# Patient Record
Sex: Male | Born: 1945
Health system: Southern US, Community
[De-identification: ages and names within clinical notes are randomized; demographics above are authoritative.]

## PROBLEM LIST (undated history)

## (undated) DIAGNOSIS — N200 Calculus of kidney: Secondary | ICD-10-CM

## (undated) DIAGNOSIS — C801 Malignant (primary) neoplasm, unspecified: Secondary | ICD-10-CM

## (undated) DIAGNOSIS — M419 Scoliosis, unspecified: Secondary | ICD-10-CM

## (undated) DIAGNOSIS — I10 Essential (primary) hypertension: Secondary | ICD-10-CM

## (undated) DIAGNOSIS — Z87442 Personal history of urinary calculi: Secondary | ICD-10-CM

## (undated) DIAGNOSIS — E119 Type 2 diabetes mellitus without complications: Secondary | ICD-10-CM

## (undated) DIAGNOSIS — E669 Obesity, unspecified: Secondary | ICD-10-CM

## (undated) HISTORY — PX: TONSILLECTOMY: SUR1361

## (undated) HISTORY — PX: LITHOTRIPSY: SUR834

---

## 1966-04-26 HISTORY — PX: HERNIA REPAIR: SHX51

## 2003-11-16 ENCOUNTER — Emergency Department (HOSPITAL_COMMUNITY): Admission: EM | Admit: 2003-11-16 | Discharge: 2003-11-16 | Payer: Self-pay | Admitting: Emergency Medicine

## 2009-10-29 ENCOUNTER — Encounter: Admission: RE | Admit: 2009-10-29 | Discharge: 2009-12-22 | Payer: Self-pay | Admitting: Endocrinology

## 2011-04-28 DIAGNOSIS — R0602 Shortness of breath: Secondary | ICD-10-CM | POA: Diagnosis not present

## 2011-04-28 DIAGNOSIS — Z23 Encounter for immunization: Secondary | ICD-10-CM | POA: Diagnosis not present

## 2011-05-05 DIAGNOSIS — M25569 Pain in unspecified knee: Secondary | ICD-10-CM | POA: Diagnosis not present

## 2011-05-05 DIAGNOSIS — M159 Polyosteoarthritis, unspecified: Secondary | ICD-10-CM | POA: Diagnosis not present

## 2011-08-03 DIAGNOSIS — M25569 Pain in unspecified knee: Secondary | ICD-10-CM | POA: Diagnosis not present

## 2011-08-03 DIAGNOSIS — M159 Polyosteoarthritis, unspecified: Secondary | ICD-10-CM | POA: Diagnosis not present

## 2011-08-09 DIAGNOSIS — H43819 Vitreous degeneration, unspecified eye: Secondary | ICD-10-CM | POA: Diagnosis not present

## 2011-08-09 DIAGNOSIS — H5310 Unspecified subjective visual disturbances: Secondary | ICD-10-CM | POA: Diagnosis not present

## 2011-08-09 DIAGNOSIS — H52229 Regular astigmatism, unspecified eye: Secondary | ICD-10-CM | POA: Diagnosis not present

## 2011-08-09 DIAGNOSIS — H5210 Myopia, unspecified eye: Secondary | ICD-10-CM | POA: Diagnosis not present

## 2011-09-02 DIAGNOSIS — M1710 Unilateral primary osteoarthritis, unspecified knee: Secondary | ICD-10-CM | POA: Diagnosis not present

## 2011-11-02 DIAGNOSIS — M159 Polyosteoarthritis, unspecified: Secondary | ICD-10-CM | POA: Diagnosis not present

## 2011-11-12 DIAGNOSIS — H01009 Unspecified blepharitis unspecified eye, unspecified eyelid: Secondary | ICD-10-CM | POA: Diagnosis not present

## 2011-11-12 DIAGNOSIS — H04129 Dry eye syndrome of unspecified lacrimal gland: Secondary | ICD-10-CM | POA: Diagnosis not present

## 2011-11-29 DIAGNOSIS — H01009 Unspecified blepharitis unspecified eye, unspecified eyelid: Secondary | ICD-10-CM | POA: Diagnosis not present

## 2011-11-29 DIAGNOSIS — H04129 Dry eye syndrome of unspecified lacrimal gland: Secondary | ICD-10-CM | POA: Diagnosis not present

## 2012-01-03 DIAGNOSIS — M159 Polyosteoarthritis, unspecified: Secondary | ICD-10-CM | POA: Diagnosis not present

## 2012-01-03 DIAGNOSIS — M25569 Pain in unspecified knee: Secondary | ICD-10-CM | POA: Diagnosis not present

## 2012-01-03 DIAGNOSIS — M1710 Unilateral primary osteoarthritis, unspecified knee: Secondary | ICD-10-CM | POA: Diagnosis not present

## 2012-01-11 DIAGNOSIS — M1710 Unilateral primary osteoarthritis, unspecified knee: Secondary | ICD-10-CM | POA: Diagnosis not present

## 2012-01-11 DIAGNOSIS — M159 Polyosteoarthritis, unspecified: Secondary | ICD-10-CM | POA: Diagnosis not present

## 2012-01-18 DIAGNOSIS — M25569 Pain in unspecified knee: Secondary | ICD-10-CM | POA: Diagnosis not present

## 2012-01-18 DIAGNOSIS — M159 Polyosteoarthritis, unspecified: Secondary | ICD-10-CM | POA: Diagnosis not present

## 2012-01-18 DIAGNOSIS — M1710 Unilateral primary osteoarthritis, unspecified knee: Secondary | ICD-10-CM | POA: Diagnosis not present

## 2012-04-17 DIAGNOSIS — Z0000 Encounter for general adult medical examination without abnormal findings: Secondary | ICD-10-CM | POA: Diagnosis not present

## 2012-04-17 DIAGNOSIS — I10 Essential (primary) hypertension: Secondary | ICD-10-CM | POA: Diagnosis not present

## 2012-04-17 DIAGNOSIS — E119 Type 2 diabetes mellitus without complications: Secondary | ICD-10-CM | POA: Diagnosis not present

## 2012-04-17 DIAGNOSIS — Z23 Encounter for immunization: Secondary | ICD-10-CM | POA: Diagnosis not present

## 2012-04-17 DIAGNOSIS — Z125 Encounter for screening for malignant neoplasm of prostate: Secondary | ICD-10-CM | POA: Diagnosis not present

## 2012-04-27 DIAGNOSIS — I10 Essential (primary) hypertension: Secondary | ICD-10-CM | POA: Diagnosis not present

## 2012-04-27 DIAGNOSIS — Z79899 Other long term (current) drug therapy: Secondary | ICD-10-CM | POA: Diagnosis not present

## 2012-04-27 DIAGNOSIS — E119 Type 2 diabetes mellitus without complications: Secondary | ICD-10-CM | POA: Diagnosis not present

## 2012-04-27 DIAGNOSIS — M79609 Pain in unspecified limb: Secondary | ICD-10-CM | POA: Diagnosis not present

## 2012-04-27 DIAGNOSIS — G589 Mononeuropathy, unspecified: Secondary | ICD-10-CM | POA: Diagnosis not present

## 2012-05-02 DIAGNOSIS — M159 Polyosteoarthritis, unspecified: Secondary | ICD-10-CM | POA: Diagnosis not present

## 2012-08-01 DIAGNOSIS — M159 Polyosteoarthritis, unspecified: Secondary | ICD-10-CM | POA: Diagnosis not present

## 2012-10-25 DIAGNOSIS — E119 Type 2 diabetes mellitus without complications: Secondary | ICD-10-CM | POA: Diagnosis not present

## 2012-10-25 DIAGNOSIS — E789 Disorder of lipoprotein metabolism, unspecified: Secondary | ICD-10-CM | POA: Diagnosis not present

## 2012-11-01 DIAGNOSIS — M79609 Pain in unspecified limb: Secondary | ICD-10-CM | POA: Diagnosis not present

## 2012-11-01 DIAGNOSIS — R7309 Other abnormal glucose: Secondary | ICD-10-CM | POA: Diagnosis not present

## 2012-11-01 DIAGNOSIS — I10 Essential (primary) hypertension: Secondary | ICD-10-CM | POA: Diagnosis not present

## 2012-11-01 DIAGNOSIS — E789 Disorder of lipoprotein metabolism, unspecified: Secondary | ICD-10-CM | POA: Diagnosis not present

## 2012-11-30 DIAGNOSIS — M159 Polyosteoarthritis, unspecified: Secondary | ICD-10-CM | POA: Diagnosis not present

## 2013-04-05 DIAGNOSIS — M25569 Pain in unspecified knee: Secondary | ICD-10-CM | POA: Diagnosis not present

## 2013-04-05 DIAGNOSIS — M1710 Unilateral primary osteoarthritis, unspecified knee: Secondary | ICD-10-CM | POA: Diagnosis not present

## 2013-04-05 DIAGNOSIS — M159 Polyosteoarthritis, unspecified: Secondary | ICD-10-CM | POA: Diagnosis not present

## 2013-04-12 DIAGNOSIS — M25569 Pain in unspecified knee: Secondary | ICD-10-CM | POA: Diagnosis not present

## 2013-04-12 DIAGNOSIS — M159 Polyosteoarthritis, unspecified: Secondary | ICD-10-CM | POA: Diagnosis not present

## 2013-04-12 DIAGNOSIS — M1710 Unilateral primary osteoarthritis, unspecified knee: Secondary | ICD-10-CM | POA: Diagnosis not present

## 2013-04-23 DIAGNOSIS — M159 Polyosteoarthritis, unspecified: Secondary | ICD-10-CM | POA: Diagnosis not present

## 2013-04-23 DIAGNOSIS — M25569 Pain in unspecified knee: Secondary | ICD-10-CM | POA: Diagnosis not present

## 2013-04-23 DIAGNOSIS — M1710 Unilateral primary osteoarthritis, unspecified knee: Secondary | ICD-10-CM | POA: Diagnosis not present

## 2013-04-27 DIAGNOSIS — Z79899 Other long term (current) drug therapy: Secondary | ICD-10-CM | POA: Diagnosis not present

## 2013-04-27 DIAGNOSIS — E119 Type 2 diabetes mellitus without complications: Secondary | ICD-10-CM | POA: Diagnosis not present

## 2013-04-27 DIAGNOSIS — E789 Disorder of lipoprotein metabolism, unspecified: Secondary | ICD-10-CM | POA: Diagnosis not present

## 2013-04-27 DIAGNOSIS — Z125 Encounter for screening for malignant neoplasm of prostate: Secondary | ICD-10-CM | POA: Diagnosis not present

## 2013-04-27 DIAGNOSIS — I10 Essential (primary) hypertension: Secondary | ICD-10-CM | POA: Diagnosis not present

## 2013-05-03 DIAGNOSIS — E739 Lactose intolerance, unspecified: Secondary | ICD-10-CM | POA: Diagnosis not present

## 2013-05-03 DIAGNOSIS — I10 Essential (primary) hypertension: Secondary | ICD-10-CM | POA: Diagnosis not present

## 2013-05-03 DIAGNOSIS — M79609 Pain in unspecified limb: Secondary | ICD-10-CM | POA: Diagnosis not present

## 2013-07-16 DIAGNOSIS — L719 Rosacea, unspecified: Secondary | ICD-10-CM | POA: Diagnosis not present

## 2013-07-16 DIAGNOSIS — H01009 Unspecified blepharitis unspecified eye, unspecified eyelid: Secondary | ICD-10-CM | POA: Diagnosis not present

## 2013-07-16 DIAGNOSIS — H2510 Age-related nuclear cataract, unspecified eye: Secondary | ICD-10-CM | POA: Diagnosis not present

## 2013-10-24 DIAGNOSIS — E789 Disorder of lipoprotein metabolism, unspecified: Secondary | ICD-10-CM | POA: Diagnosis not present

## 2013-10-24 DIAGNOSIS — E119 Type 2 diabetes mellitus without complications: Secondary | ICD-10-CM | POA: Diagnosis not present

## 2013-10-31 DIAGNOSIS — I10 Essential (primary) hypertension: Secondary | ICD-10-CM | POA: Diagnosis not present

## 2013-10-31 DIAGNOSIS — E789 Disorder of lipoprotein metabolism, unspecified: Secondary | ICD-10-CM | POA: Diagnosis not present

## 2014-01-03 DIAGNOSIS — M159 Polyosteoarthritis, unspecified: Secondary | ICD-10-CM | POA: Diagnosis not present

## 2014-01-03 DIAGNOSIS — M25569 Pain in unspecified knee: Secondary | ICD-10-CM | POA: Diagnosis not present

## 2014-04-09 DIAGNOSIS — Z23 Encounter for immunization: Secondary | ICD-10-CM | POA: Diagnosis not present

## 2014-04-29 DIAGNOSIS — Z79899 Other long term (current) drug therapy: Secondary | ICD-10-CM | POA: Diagnosis not present

## 2014-04-29 DIAGNOSIS — Z125 Encounter for screening for malignant neoplasm of prostate: Secondary | ICD-10-CM | POA: Diagnosis not present

## 2014-04-29 DIAGNOSIS — I10 Essential (primary) hypertension: Secondary | ICD-10-CM | POA: Diagnosis not present

## 2014-04-29 DIAGNOSIS — R739 Hyperglycemia, unspecified: Secondary | ICD-10-CM | POA: Diagnosis not present

## 2014-04-29 DIAGNOSIS — E789 Disorder of lipoprotein metabolism, unspecified: Secondary | ICD-10-CM | POA: Diagnosis not present

## 2014-05-06 DIAGNOSIS — E789 Disorder of lipoprotein metabolism, unspecified: Secondary | ICD-10-CM | POA: Diagnosis not present

## 2014-05-06 DIAGNOSIS — E118 Type 2 diabetes mellitus with unspecified complications: Secondary | ICD-10-CM | POA: Diagnosis not present

## 2014-05-06 DIAGNOSIS — M1990 Unspecified osteoarthritis, unspecified site: Secondary | ICD-10-CM | POA: Diagnosis not present

## 2014-05-06 DIAGNOSIS — I10 Essential (primary) hypertension: Secondary | ICD-10-CM | POA: Diagnosis not present

## 2014-05-07 DIAGNOSIS — M170 Bilateral primary osteoarthritis of knee: Secondary | ICD-10-CM | POA: Diagnosis not present

## 2014-05-07 DIAGNOSIS — M25561 Pain in right knee: Secondary | ICD-10-CM | POA: Diagnosis not present

## 2014-07-18 DIAGNOSIS — H35373 Puckering of macula, bilateral: Secondary | ICD-10-CM | POA: Diagnosis not present

## 2014-07-18 DIAGNOSIS — H43811 Vitreous degeneration, right eye: Secondary | ICD-10-CM | POA: Diagnosis not present

## 2014-07-18 DIAGNOSIS — H2513 Age-related nuclear cataract, bilateral: Secondary | ICD-10-CM | POA: Diagnosis not present

## 2014-09-05 DIAGNOSIS — M25569 Pain in unspecified knee: Secondary | ICD-10-CM | POA: Diagnosis not present

## 2014-09-05 DIAGNOSIS — M170 Bilateral primary osteoarthritis of knee: Secondary | ICD-10-CM | POA: Diagnosis not present

## 2014-09-05 DIAGNOSIS — M179 Osteoarthritis of knee, unspecified: Secondary | ICD-10-CM | POA: Diagnosis not present

## 2014-09-12 DIAGNOSIS — M25569 Pain in unspecified knee: Secondary | ICD-10-CM | POA: Diagnosis not present

## 2014-09-12 DIAGNOSIS — M170 Bilateral primary osteoarthritis of knee: Secondary | ICD-10-CM | POA: Diagnosis not present

## 2014-09-19 DIAGNOSIS — M25569 Pain in unspecified knee: Secondary | ICD-10-CM | POA: Diagnosis not present

## 2014-09-19 DIAGNOSIS — M1710 Unilateral primary osteoarthritis, unspecified knee: Secondary | ICD-10-CM | POA: Diagnosis not present

## 2014-09-19 DIAGNOSIS — M170 Bilateral primary osteoarthritis of knee: Secondary | ICD-10-CM | POA: Diagnosis not present

## 2014-10-24 DIAGNOSIS — E119 Type 2 diabetes mellitus without complications: Secondary | ICD-10-CM | POA: Diagnosis not present

## 2014-11-04 DIAGNOSIS — E118 Type 2 diabetes mellitus with unspecified complications: Secondary | ICD-10-CM | POA: Diagnosis not present

## 2014-11-04 DIAGNOSIS — M25569 Pain in unspecified knee: Secondary | ICD-10-CM | POA: Diagnosis not present

## 2014-11-04 DIAGNOSIS — I10 Essential (primary) hypertension: Secondary | ICD-10-CM | POA: Diagnosis not present

## 2015-04-30 DIAGNOSIS — Z125 Encounter for screening for malignant neoplasm of prostate: Secondary | ICD-10-CM | POA: Diagnosis not present

## 2015-04-30 DIAGNOSIS — I10 Essential (primary) hypertension: Secondary | ICD-10-CM | POA: Diagnosis not present

## 2015-04-30 DIAGNOSIS — Z79899 Other long term (current) drug therapy: Secondary | ICD-10-CM | POA: Diagnosis not present

## 2015-04-30 DIAGNOSIS — E119 Type 2 diabetes mellitus without complications: Secondary | ICD-10-CM | POA: Diagnosis not present

## 2015-04-30 DIAGNOSIS — E789 Disorder of lipoprotein metabolism, unspecified: Secondary | ICD-10-CM | POA: Diagnosis not present

## 2015-05-07 DIAGNOSIS — I10 Essential (primary) hypertension: Secondary | ICD-10-CM | POA: Diagnosis not present

## 2015-05-07 DIAGNOSIS — Z23 Encounter for immunization: Secondary | ICD-10-CM | POA: Diagnosis not present

## 2015-05-07 DIAGNOSIS — L309 Dermatitis, unspecified: Secondary | ICD-10-CM | POA: Diagnosis not present

## 2016-11-05 DIAGNOSIS — H35373 Puckering of macula, bilateral: Secondary | ICD-10-CM | POA: Diagnosis not present

## 2016-11-05 DIAGNOSIS — H43811 Vitreous degeneration, right eye: Secondary | ICD-10-CM | POA: Diagnosis not present

## 2016-11-05 DIAGNOSIS — H2513 Age-related nuclear cataract, bilateral: Secondary | ICD-10-CM | POA: Diagnosis not present

## 2016-12-16 DIAGNOSIS — E119 Type 2 diabetes mellitus without complications: Secondary | ICD-10-CM | POA: Diagnosis not present

## 2016-12-16 DIAGNOSIS — M1990 Unspecified osteoarthritis, unspecified site: Secondary | ICD-10-CM | POA: Diagnosis not present

## 2016-12-16 DIAGNOSIS — E785 Hyperlipidemia, unspecified: Secondary | ICD-10-CM | POA: Diagnosis not present

## 2016-12-16 DIAGNOSIS — I10 Essential (primary) hypertension: Secondary | ICD-10-CM | POA: Diagnosis not present

## 2017-01-06 DIAGNOSIS — I10 Essential (primary) hypertension: Secondary | ICD-10-CM | POA: Diagnosis not present

## 2017-01-06 DIAGNOSIS — E789 Disorder of lipoprotein metabolism, unspecified: Secondary | ICD-10-CM | POA: Diagnosis not present

## 2017-01-06 DIAGNOSIS — Z125 Encounter for screening for malignant neoplasm of prostate: Secondary | ICD-10-CM | POA: Diagnosis not present

## 2017-01-06 DIAGNOSIS — E119 Type 2 diabetes mellitus without complications: Secondary | ICD-10-CM | POA: Diagnosis not present

## 2017-01-13 DIAGNOSIS — I10 Essential (primary) hypertension: Secondary | ICD-10-CM | POA: Diagnosis not present

## 2017-01-13 DIAGNOSIS — Z0000 Encounter for general adult medical examination without abnormal findings: Secondary | ICD-10-CM | POA: Diagnosis not present

## 2017-01-13 DIAGNOSIS — E782 Mixed hyperlipidemia: Secondary | ICD-10-CM | POA: Diagnosis not present

## 2017-01-13 DIAGNOSIS — M419 Scoliosis, unspecified: Secondary | ICD-10-CM | POA: Diagnosis not present

## 2017-01-13 DIAGNOSIS — Z23 Encounter for immunization: Secondary | ICD-10-CM | POA: Diagnosis not present

## 2017-01-13 DIAGNOSIS — M150 Primary generalized (osteo)arthritis: Secondary | ICD-10-CM | POA: Diagnosis not present

## 2017-01-13 DIAGNOSIS — E119 Type 2 diabetes mellitus without complications: Secondary | ICD-10-CM | POA: Diagnosis not present

## 2017-07-28 DIAGNOSIS — I10 Essential (primary) hypertension: Secondary | ICD-10-CM | POA: Diagnosis not present

## 2017-07-28 DIAGNOSIS — E782 Mixed hyperlipidemia: Secondary | ICD-10-CM | POA: Diagnosis not present

## 2017-07-28 DIAGNOSIS — E119 Type 2 diabetes mellitus without complications: Secondary | ICD-10-CM | POA: Diagnosis not present

## 2017-08-04 DIAGNOSIS — E782 Mixed hyperlipidemia: Secondary | ICD-10-CM | POA: Diagnosis not present

## 2017-08-04 DIAGNOSIS — E1169 Type 2 diabetes mellitus with other specified complication: Secondary | ICD-10-CM | POA: Diagnosis not present

## 2017-08-04 DIAGNOSIS — M150 Primary generalized (osteo)arthritis: Secondary | ICD-10-CM | POA: Diagnosis not present

## 2017-08-04 DIAGNOSIS — E1165 Type 2 diabetes mellitus with hyperglycemia: Secondary | ICD-10-CM | POA: Diagnosis not present

## 2017-08-04 DIAGNOSIS — I10 Essential (primary) hypertension: Secondary | ICD-10-CM | POA: Diagnosis not present

## 2018-01-19 DIAGNOSIS — E1169 Type 2 diabetes mellitus with other specified complication: Secondary | ICD-10-CM | POA: Diagnosis not present

## 2018-01-19 DIAGNOSIS — R454 Irritability and anger: Secondary | ICD-10-CM | POA: Diagnosis not present

## 2018-01-19 DIAGNOSIS — Z23 Encounter for immunization: Secondary | ICD-10-CM | POA: Diagnosis not present

## 2018-01-19 DIAGNOSIS — I10 Essential (primary) hypertension: Secondary | ICD-10-CM | POA: Diagnosis not present

## 2018-01-19 DIAGNOSIS — N390 Urinary tract infection, site not specified: Secondary | ICD-10-CM | POA: Diagnosis not present

## 2018-01-19 DIAGNOSIS — Z125 Encounter for screening for malignant neoplasm of prostate: Secondary | ICD-10-CM | POA: Diagnosis not present

## 2018-01-19 DIAGNOSIS — E782 Mixed hyperlipidemia: Secondary | ICD-10-CM | POA: Diagnosis not present

## 2018-01-19 DIAGNOSIS — R319 Hematuria, unspecified: Secondary | ICD-10-CM | POA: Diagnosis not present

## 2018-01-19 DIAGNOSIS — Z0000 Encounter for general adult medical examination without abnormal findings: Secondary | ICD-10-CM | POA: Diagnosis not present

## 2018-01-26 DIAGNOSIS — I10 Essential (primary) hypertension: Secondary | ICD-10-CM | POA: Diagnosis not present

## 2018-01-26 DIAGNOSIS — E1165 Type 2 diabetes mellitus with hyperglycemia: Secondary | ICD-10-CM | POA: Diagnosis not present

## 2018-01-26 DIAGNOSIS — E1121 Type 2 diabetes mellitus with diabetic nephropathy: Secondary | ICD-10-CM | POA: Diagnosis not present

## 2018-01-26 DIAGNOSIS — M150 Primary generalized (osteo)arthritis: Secondary | ICD-10-CM | POA: Diagnosis not present

## 2018-01-26 DIAGNOSIS — M170 Bilateral primary osteoarthritis of knee: Secondary | ICD-10-CM | POA: Diagnosis not present

## 2018-01-26 DIAGNOSIS — E782 Mixed hyperlipidemia: Secondary | ICD-10-CM | POA: Diagnosis not present

## 2018-01-26 DIAGNOSIS — Z0000 Encounter for general adult medical examination without abnormal findings: Secondary | ICD-10-CM | POA: Diagnosis not present

## 2018-01-26 DIAGNOSIS — E1169 Type 2 diabetes mellitus with other specified complication: Secondary | ICD-10-CM | POA: Diagnosis not present

## 2018-05-18 DIAGNOSIS — I10 Essential (primary) hypertension: Secondary | ICD-10-CM | POA: Diagnosis not present

## 2018-05-18 DIAGNOSIS — E782 Mixed hyperlipidemia: Secondary | ICD-10-CM | POA: Diagnosis not present

## 2018-05-18 DIAGNOSIS — E1169 Type 2 diabetes mellitus with other specified complication: Secondary | ICD-10-CM | POA: Diagnosis not present

## 2018-05-25 DIAGNOSIS — E782 Mixed hyperlipidemia: Secondary | ICD-10-CM | POA: Diagnosis not present

## 2018-05-25 DIAGNOSIS — E1169 Type 2 diabetes mellitus with other specified complication: Secondary | ICD-10-CM | POA: Diagnosis not present

## 2018-05-25 DIAGNOSIS — I10 Essential (primary) hypertension: Secondary | ICD-10-CM | POA: Diagnosis not present

## 2018-05-25 DIAGNOSIS — E1165 Type 2 diabetes mellitus with hyperglycemia: Secondary | ICD-10-CM | POA: Diagnosis not present

## 2018-09-28 DIAGNOSIS — E1165 Type 2 diabetes mellitus with hyperglycemia: Secondary | ICD-10-CM | POA: Diagnosis not present

## 2018-09-28 DIAGNOSIS — I10 Essential (primary) hypertension: Secondary | ICD-10-CM | POA: Diagnosis not present

## 2018-09-28 DIAGNOSIS — E1169 Type 2 diabetes mellitus with other specified complication: Secondary | ICD-10-CM | POA: Diagnosis not present

## 2018-09-28 DIAGNOSIS — E782 Mixed hyperlipidemia: Secondary | ICD-10-CM | POA: Diagnosis not present

## 2018-10-05 DIAGNOSIS — E1169 Type 2 diabetes mellitus with other specified complication: Secondary | ICD-10-CM | POA: Diagnosis not present

## 2018-10-05 DIAGNOSIS — E782 Mixed hyperlipidemia: Secondary | ICD-10-CM | POA: Diagnosis not present

## 2018-10-05 DIAGNOSIS — I10 Essential (primary) hypertension: Secondary | ICD-10-CM | POA: Diagnosis not present

## 2018-10-05 DIAGNOSIS — E1165 Type 2 diabetes mellitus with hyperglycemia: Secondary | ICD-10-CM | POA: Diagnosis not present

## 2018-10-05 DIAGNOSIS — Z7189 Other specified counseling: Secondary | ICD-10-CM | POA: Diagnosis not present

## 2018-10-05 DIAGNOSIS — E1121 Type 2 diabetes mellitus with diabetic nephropathy: Secondary | ICD-10-CM | POA: Diagnosis not present

## 2018-11-27 ENCOUNTER — Other Ambulatory Visit: Payer: Self-pay

## 2019-02-01 DIAGNOSIS — Z0000 Encounter for general adult medical examination without abnormal findings: Secondary | ICD-10-CM | POA: Diagnosis not present

## 2019-02-01 DIAGNOSIS — E1121 Type 2 diabetes mellitus with diabetic nephropathy: Secondary | ICD-10-CM | POA: Diagnosis not present

## 2019-02-01 DIAGNOSIS — Z7189 Other specified counseling: Secondary | ICD-10-CM | POA: Diagnosis not present

## 2019-02-01 DIAGNOSIS — E782 Mixed hyperlipidemia: Secondary | ICD-10-CM | POA: Diagnosis not present

## 2019-02-01 DIAGNOSIS — E1169 Type 2 diabetes mellitus with other specified complication: Secondary | ICD-10-CM | POA: Diagnosis not present

## 2019-02-01 DIAGNOSIS — Z23 Encounter for immunization: Secondary | ICD-10-CM | POA: Diagnosis not present

## 2019-02-01 DIAGNOSIS — I10 Essential (primary) hypertension: Secondary | ICD-10-CM | POA: Diagnosis not present

## 2019-02-08 DIAGNOSIS — E1165 Type 2 diabetes mellitus with hyperglycemia: Secondary | ICD-10-CM | POA: Diagnosis not present

## 2019-02-08 DIAGNOSIS — M419 Scoliosis, unspecified: Secondary | ICD-10-CM | POA: Diagnosis not present

## 2019-02-08 DIAGNOSIS — E782 Mixed hyperlipidemia: Secondary | ICD-10-CM | POA: Diagnosis not present

## 2019-02-08 DIAGNOSIS — Z7189 Other specified counseling: Secondary | ICD-10-CM | POA: Diagnosis not present

## 2019-02-08 DIAGNOSIS — E119 Type 2 diabetes mellitus without complications: Secondary | ICD-10-CM | POA: Diagnosis not present

## 2019-02-08 DIAGNOSIS — I10 Essential (primary) hypertension: Secondary | ICD-10-CM | POA: Diagnosis not present

## 2019-02-08 DIAGNOSIS — E1121 Type 2 diabetes mellitus with diabetic nephropathy: Secondary | ICD-10-CM | POA: Diagnosis not present

## 2019-02-08 DIAGNOSIS — M170 Bilateral primary osteoarthritis of knee: Secondary | ICD-10-CM | POA: Diagnosis not present

## 2019-07-03 DIAGNOSIS — E782 Mixed hyperlipidemia: Secondary | ICD-10-CM | POA: Diagnosis not present

## 2019-07-03 DIAGNOSIS — I10 Essential (primary) hypertension: Secondary | ICD-10-CM | POA: Diagnosis not present

## 2019-07-03 DIAGNOSIS — M170 Bilateral primary osteoarthritis of knee: Secondary | ICD-10-CM | POA: Diagnosis not present

## 2019-07-03 DIAGNOSIS — E1165 Type 2 diabetes mellitus with hyperglycemia: Secondary | ICD-10-CM | POA: Diagnosis not present

## 2019-07-03 DIAGNOSIS — E119 Type 2 diabetes mellitus without complications: Secondary | ICD-10-CM | POA: Diagnosis not present

## 2019-07-10 DIAGNOSIS — I10 Essential (primary) hypertension: Secondary | ICD-10-CM | POA: Diagnosis not present

## 2019-07-10 DIAGNOSIS — H6121 Impacted cerumen, right ear: Secondary | ICD-10-CM | POA: Diagnosis not present

## 2019-07-10 DIAGNOSIS — E782 Mixed hyperlipidemia: Secondary | ICD-10-CM | POA: Diagnosis not present

## 2019-07-10 DIAGNOSIS — E1121 Type 2 diabetes mellitus with diabetic nephropathy: Secondary | ICD-10-CM | POA: Diagnosis not present

## 2019-07-10 DIAGNOSIS — E1165 Type 2 diabetes mellitus with hyperglycemia: Secondary | ICD-10-CM | POA: Diagnosis not present

## 2019-07-10 DIAGNOSIS — E1169 Type 2 diabetes mellitus with other specified complication: Secondary | ICD-10-CM | POA: Diagnosis not present

## 2019-07-17 DIAGNOSIS — H6120 Impacted cerumen, unspecified ear: Secondary | ICD-10-CM | POA: Diagnosis not present

## 2020-01-03 ENCOUNTER — Emergency Department (HOSPITAL_COMMUNITY): Payer: Medicare Other

## 2020-01-03 ENCOUNTER — Other Ambulatory Visit: Payer: Self-pay

## 2020-01-03 ENCOUNTER — Encounter (HOSPITAL_COMMUNITY): Payer: Self-pay | Admitting: Emergency Medicine

## 2020-01-03 ENCOUNTER — Inpatient Hospital Stay (HOSPITAL_COMMUNITY)
Admission: EM | Admit: 2020-01-03 | Discharge: 2020-01-06 | DRG: 683 | Disposition: A | Discharge status: Home / Self Care | Payer: Medicare Other | Attending: Family Medicine | Admitting: Family Medicine

## 2020-01-03 DIAGNOSIS — E785 Hyperlipidemia, unspecified: Secondary | ICD-10-CM | POA: Diagnosis present

## 2020-01-03 DIAGNOSIS — E876 Hypokalemia: Secondary | ICD-10-CM | POA: Diagnosis present

## 2020-01-03 DIAGNOSIS — I10 Essential (primary) hypertension: Secondary | ICD-10-CM | POA: Diagnosis present

## 2020-01-03 DIAGNOSIS — M545 Low back pain: Secondary | ICD-10-CM | POA: Diagnosis not present

## 2020-01-03 DIAGNOSIS — N179 Acute kidney failure, unspecified: Principal | ICD-10-CM | POA: Diagnosis present

## 2020-01-03 DIAGNOSIS — N210 Calculus in bladder: Secondary | ICD-10-CM | POA: Diagnosis present

## 2020-01-03 DIAGNOSIS — W19XXXA Unspecified fall, initial encounter: Secondary | ICD-10-CM | POA: Diagnosis not present

## 2020-01-03 DIAGNOSIS — Z20822 Contact with and (suspected) exposure to covid-19: Secondary | ICD-10-CM | POA: Diagnosis present

## 2020-01-03 DIAGNOSIS — N1330 Unspecified hydronephrosis: Secondary | ICD-10-CM | POA: Diagnosis present

## 2020-01-03 DIAGNOSIS — E669 Obesity, unspecified: Secondary | ICD-10-CM | POA: Diagnosis present

## 2020-01-03 DIAGNOSIS — Z79899 Other long term (current) drug therapy: Secondary | ICD-10-CM | POA: Diagnosis not present

## 2020-01-03 DIAGNOSIS — N390 Urinary tract infection, site not specified: Secondary | ICD-10-CM | POA: Diagnosis not present

## 2020-01-03 DIAGNOSIS — N320 Bladder-neck obstruction: Secondary | ICD-10-CM | POA: Diagnosis present

## 2020-01-03 DIAGNOSIS — D649 Anemia, unspecified: Secondary | ICD-10-CM | POA: Diagnosis present

## 2020-01-03 DIAGNOSIS — Z87442 Personal history of urinary calculi: Secondary | ICD-10-CM | POA: Diagnosis not present

## 2020-01-03 DIAGNOSIS — E1122 Type 2 diabetes mellitus with diabetic chronic kidney disease: Secondary | ICD-10-CM | POA: Diagnosis present

## 2020-01-03 DIAGNOSIS — N138 Other obstructive and reflux uropathy: Secondary | ICD-10-CM | POA: Diagnosis present

## 2020-01-03 DIAGNOSIS — G4700 Insomnia, unspecified: Secondary | ICD-10-CM | POA: Diagnosis present

## 2020-01-03 DIAGNOSIS — N400 Enlarged prostate without lower urinary tract symptoms: Secondary | ICD-10-CM

## 2020-01-03 DIAGNOSIS — D72829 Elevated white blood cell count, unspecified: Secondary | ICD-10-CM | POA: Diagnosis not present

## 2020-01-03 DIAGNOSIS — S0081XA Abrasion of other part of head, initial encounter: Secondary | ICD-10-CM | POA: Diagnosis present

## 2020-01-03 DIAGNOSIS — W101XXA Fall (on)(from) sidewalk curb, initial encounter: Secondary | ICD-10-CM | POA: Diagnosis present

## 2020-01-03 DIAGNOSIS — Z7984 Long term (current) use of oral hypoglycemic drugs: Secondary | ICD-10-CM

## 2020-01-03 DIAGNOSIS — M419 Scoliosis, unspecified: Secondary | ICD-10-CM | POA: Diagnosis present

## 2020-01-03 DIAGNOSIS — M1712 Unilateral primary osteoarthritis, left knee: Secondary | ICD-10-CM | POA: Diagnosis present

## 2020-01-03 DIAGNOSIS — R339 Retention of urine, unspecified: Secondary | ICD-10-CM | POA: Diagnosis not present

## 2020-01-03 DIAGNOSIS — N200 Calculus of kidney: Secondary | ICD-10-CM

## 2020-01-03 DIAGNOSIS — N136 Pyonephrosis: Secondary | ICD-10-CM | POA: Diagnosis present

## 2020-01-03 DIAGNOSIS — I129 Hypertensive chronic kidney disease with stage 1 through stage 4 chronic kidney disease, or unspecified chronic kidney disease: Secondary | ICD-10-CM | POA: Diagnosis present

## 2020-01-03 DIAGNOSIS — N401 Enlarged prostate with lower urinary tract symptoms: Secondary | ICD-10-CM | POA: Diagnosis not present

## 2020-01-03 DIAGNOSIS — R338 Other retention of urine: Secondary | ICD-10-CM | POA: Diagnosis not present

## 2020-01-03 DIAGNOSIS — Z6839 Body mass index (BMI) 39.0-39.9, adult: Secondary | ICD-10-CM

## 2020-01-03 DIAGNOSIS — R310 Gross hematuria: Secondary | ICD-10-CM | POA: Diagnosis present

## 2020-01-03 DIAGNOSIS — N189 Chronic kidney disease, unspecified: Secondary | ICD-10-CM | POA: Diagnosis present

## 2020-01-03 DIAGNOSIS — N3289 Other specified disorders of bladder: Secondary | ICD-10-CM

## 2020-01-03 HISTORY — DX: Essential (primary) hypertension: I10

## 2020-01-03 HISTORY — DX: Obesity, unspecified: E66.9

## 2020-01-03 HISTORY — DX: Scoliosis, unspecified: M41.9

## 2020-01-03 HISTORY — DX: Calculus of kidney: N20.0

## 2020-01-03 HISTORY — DX: Type 2 diabetes mellitus without complications: E11.9

## 2020-01-03 LAB — CBC WITH DIFFERENTIAL/PLATELET
Abs Immature Granulocytes: 0.04 10*3/uL (ref 0.00–0.07)
Basophils Absolute: 0 10*3/uL (ref 0.0–0.1)
Basophils Relative: 0 %
Eosinophils Absolute: 0.1 10*3/uL (ref 0.0–0.5)
Eosinophils Relative: 0 %
HCT: 35.6 % — ABNORMAL LOW (ref 39.0–52.0)
Hemoglobin: 11.7 g/dL — ABNORMAL LOW (ref 13.0–17.0)
Immature Granulocytes: 0 %
Lymphocytes Relative: 10 %
Lymphs Abs: 1.1 10*3/uL (ref 0.7–4.0)
MCH: 30.9 pg (ref 26.0–34.0)
MCHC: 32.9 g/dL (ref 30.0–36.0)
MCV: 93.9 fL (ref 80.0–100.0)
Monocytes Absolute: 1.4 10*3/uL — ABNORMAL HIGH (ref 0.1–1.0)
Monocytes Relative: 12 %
Neutro Abs: 9.2 10*3/uL — ABNORMAL HIGH (ref 1.7–7.7)
Neutrophils Relative %: 78 %
Platelets: 287 10*3/uL (ref 150–400)
RBC: 3.79 MIL/uL — ABNORMAL LOW (ref 4.22–5.81)
RDW: 13.2 % (ref 11.5–15.5)
WBC: 11.8 10*3/uL — ABNORMAL HIGH (ref 4.0–10.5)
nRBC: 0 % (ref 0.0–0.2)

## 2020-01-03 LAB — CREATININE, URINE, RANDOM: Creatinine, Urine: 121.39 mg/dL

## 2020-01-03 LAB — BASIC METABOLIC PANEL
Anion gap: 14 (ref 5–15)
BUN: 63 mg/dL — ABNORMAL HIGH (ref 8–23)
CO2: 23 mmol/L (ref 22–32)
Calcium: 9 mg/dL (ref 8.9–10.3)
Chloride: 98 mmol/L (ref 98–111)
Creatinine, Ser: 5.07 mg/dL — ABNORMAL HIGH (ref 0.61–1.24)
GFR calc Af Amer: 12 mL/min — ABNORMAL LOW (ref >60)
GFR calc non Af Amer: 10 mL/min — ABNORMAL LOW (ref >60)
Glucose, Bld: 130 mg/dL — ABNORMAL HIGH (ref 70–99)
Potassium: 4.4 mmol/L (ref 3.5–5.1)
Sodium: 135 mmol/L (ref 135–145)

## 2020-01-03 LAB — URINALYSIS, ROUTINE W REFLEX MICROSCOPIC
Bilirubin Urine: NEGATIVE
Glucose, UA: NEGATIVE mg/dL
Ketones, ur: NEGATIVE mg/dL
Nitrite: NEGATIVE
Protein, ur: NEGATIVE mg/dL
Specific Gravity, Urine: 1.011 (ref 1.005–1.030)
pH: 5 (ref 5.0–8.0)

## 2020-01-03 LAB — NA AND K (SODIUM & POTASSIUM), RAND UR
Potassium Urine: 35 mmol/L
Sodium, Ur: 41 mmol/L

## 2020-01-03 LAB — CK: Total CK: 105 U/L (ref 49–397)

## 2020-01-03 LAB — SARS CORONAVIRUS 2 BY RT PCR (HOSPITAL ORDER, PERFORMED IN ~~LOC~~ HOSPITAL LAB): SARS Coronavirus 2: NEGATIVE

## 2020-01-03 LAB — HEMOGLOBIN A1C
Hgb A1c MFr Bld: 5.8 % — ABNORMAL HIGH (ref 4.8–5.6)
Mean Plasma Glucose: 119.76 mg/dL

## 2020-01-03 MED ORDER — ACETAMINOPHEN 650 MG RE SUPP: 650.0000 mg | Freq: Four times a day (QID) | RECTAL | Status: DC | PRN

## 2020-01-03 MED ORDER — SODIUM CHLORIDE 0.9 % IV SOLN
1.0000 g | INTRAVENOUS | Status: DC
  Filled 2020-01-03: qty 10

## 2020-01-03 MED ORDER — SODIUM CHLORIDE 0.9 % IV SOLN
1.0000 g | Freq: Once | INTRAVENOUS | Status: AC
  Administered 2020-01-03: 1 g via INTRAVENOUS
  Filled 2020-01-03: qty 10

## 2020-01-03 MED ORDER — SODIUM CHLORIDE 0.9 % IV SOLN: INTRAVENOUS | Status: DC

## 2020-01-03 MED ORDER — FINASTERIDE 5 MG PO TABS
5.0000 mg | ORAL_TABLET | Freq: Every day | ORAL | Status: DC
  Administered 2020-01-03 – 2020-01-06 (×4): 5 mg via ORAL
  Filled 2020-01-03 (×3): qty 1

## 2020-01-03 MED ORDER — TAMSULOSIN HCL 0.4 MG PO CAPS
0.4000 mg | ORAL_CAPSULE | Freq: Every day | ORAL | Status: DC
  Administered 2020-01-03 – 2020-01-05 (×3): 0.4 mg via ORAL
  Filled 2020-01-03 (×2): qty 1

## 2020-01-03 MED ORDER — ACETAMINOPHEN 500 MG PO TABS
1000.0000 mg | ORAL_TABLET | Freq: Once | ORAL | Status: AC
  Administered 2020-01-03: 1000 mg via ORAL
  Filled 2020-01-03: qty 2

## 2020-01-03 MED ORDER — ATENOLOL 50 MG PO TABS
50.0000 mg | ORAL_TABLET | Freq: Every day | ORAL | Status: DC
  Administered 2020-01-04 – 2020-01-06 (×3): 50 mg via ORAL
  Filled 2020-01-03 (×3): qty 1

## 2020-01-03 MED ORDER — ACETAMINOPHEN 325 MG PO TABS
650.0000 mg | ORAL_TABLET | Freq: Four times a day (QID) | ORAL | Status: DC | PRN
  Administered 2020-01-04: 650 mg via ORAL
  Filled 2020-01-03: qty 2

## 2020-01-03 MED ORDER — TAMSULOSIN HCL 0.4 MG PO CAPS: 0.4000 mg | ORAL_CAPSULE | Freq: Every day | ORAL | Status: DC

## 2020-01-03 MED ORDER — ONDANSETRON HCL 4 MG/2ML IJ SOLN: 4.0000 mg | Freq: Four times a day (QID) | INTRAMUSCULAR | Status: DC | PRN

## 2020-01-03 MED ORDER — INSULIN ASPART 100 UNIT/ML ~~LOC~~ SOLN
0.0000 [IU] | Freq: Three times a day (TID) | SUBCUTANEOUS | Status: DC
  Filled 2020-01-03: qty 0.06

## 2020-01-03 MED ORDER — ALBUTEROL SULFATE (2.5 MG/3ML) 0.083% IN NEBU: 2.5000 mg | INHALATION_SOLUTION | Freq: Four times a day (QID) | RESPIRATORY_TRACT | Status: DC | PRN

## 2020-01-03 MED ORDER — OXYCODONE HCL 5 MG PO TABS
5.0000 mg | ORAL_TABLET | Freq: Once | ORAL | Status: AC
  Administered 2020-01-03: 5 mg via ORAL
  Filled 2020-01-03: qty 1

## 2020-01-03 MED ORDER — HEPARIN SODIUM (PORCINE) 5000 UNIT/ML IJ SOLN
5000.0000 [IU] | Freq: Three times a day (TID) | INTRAMUSCULAR | Status: DC
  Administered 2020-01-03 – 2020-01-06 (×9): 5000 [IU] via SUBCUTANEOUS
  Filled 2020-01-03 (×9): qty 1

## 2020-01-03 MED ORDER — ONDANSETRON HCL 4 MG PO TABS: 4.0000 mg | ORAL_TABLET | Freq: Four times a day (QID) | ORAL | Status: DC | PRN

## 2020-01-03 MED ORDER — SODIUM CHLORIDE 0.9% FLUSH
3.0000 mL | Freq: Two times a day (BID) | INTRAVENOUS | Status: DC
  Administered 2020-01-04 – 2020-01-06 (×2): 3 mL via INTRAVENOUS

## 2020-01-03 MED ORDER — SODIUM CHLORIDE 0.9 % IV BOLUS
1000.0000 mL | Freq: Once | INTRAVENOUS | Status: AC
  Administered 2020-01-03: 1000 mL via INTRAVENOUS

## 2020-01-03 NOTE — H&P (Signed)
History and Physical    Carlos Alvarez JEH:[id number] DOB: March 14, 1946 DOA: 01/03/2020  Referring MD/NP/PA: Octaviano Glow, MD PCP: System, Pcp Not In  Patient coming from: home via EMS  Chief Complaint: Back pain  I have personally briefly reviewed patient's old medical records in [place]   HPI: Carlos Alvarez is a 74 y.o. male with medical history significant of hypertension, diabetes mellitus type 2, and scoliosis who presents back pain.  At baseline patient reports walking with a cane due severe osteoarthritis of the left knee which he reports bone-on-bone.  Yesterday while trying to go into this office at work he had not stepped high enough to get on the curb and lost his balance falling down onto the pavement.  He hit his forehead, but denied any loss of consciousness.  EMS was called, but they were able to get him up and he declined transport at that time.  He reports after getting home and his back pain was more severe and due to his history of scoliosis he became concerned and came to the hospital for further evaluation.  It appears though that over the last 3 weeks he has been having swelling in his legs worse on the left going up into his thigh, decreased urine output, increased frequency noted at night, and insomnia due to back pain.  He denies any prior history of heart disease.  His last blood work was back in February of this year with his primary care provider,and at that time his understanding was that he was placed on Metformin due to issues with his kidneys.  Recalls several years ago having kidney stones and requiring to be seen by urology for which it sounds like he possibly had lithotripsy performed.  ED Course: Upon admission into the emergency department patient was seen to be afebrile, pulse 56-69, blood pressure is elevated up to 181/82, and all other vital signs maintained.  Labs significant for WBC 11.8, hemoglobin 11.7, BUN 63, and creatinine 5.07.  Urinalysis was  positive for trace leukocytes and many bacteria.  CT imaging of the lumbar spine was significant for no acute fractures, but did note over distention of the bladder with bilateral hydroureteronephrosis and numerous calculi layering within the bladder and a large left renal calculus.  A Foley catheter was placed and over 4.5 L of urine was initially collected.  Patient was given oxycodone 5 mg,  1 L normal saline IV fluids, and 1 g of Rocephin IV.  TRH called to admit.  MRI did not note any signs of an acute fracture of hip.  Review of Systems  Constitutional: Positive for malaise/fatigue. Negative for fever.  HENT: Negative for congestion.   Eyes: Negative for photophobia and pain.  Respiratory: Negative for cough, shortness of breath and stridor.   Cardiovascular: Positive for leg swelling. Negative for chest pain.  Gastrointestinal: Positive for abdominal pain. Negative for nausea and vomiting.  Genitourinary: Positive for frequency.       Positive for nocturia  Musculoskeletal: Positive for back pain, falls, joint pain and myalgias.  Skin: Negative for itching.  Neurological: Negative for focal weakness and loss of consciousness.  Endo/Heme/Allergies: Negative for polydipsia. Does not bruise/bleed easily.  Psychiatric/Behavioral: Negative for substance abuse. The patient has insomnia.     Past Medical History:  Diagnosis Date  . Diabetes mellitus without complication ([place])   . Hypertension   . Obesity (BMI 30-39.9)   . Scoliosis     Past Surgical History:  Procedure  Laterality Date  . LITHOTRIPSY       reports that he has never smoked. He has never used smokeless tobacco. He reports previous alcohol use. He reports previous drug use.  No Known Allergies  History reviewed. No pertinent family history.  Prior to Admission medications   Medication Sig Start Date End Date Taking? Authorizing Provider  acetaminophen (TYLENOL) 500 MG tablet Take 500 mg by mouth every 6 (six) hours  as needed for mild pain or headache.   Yes [provider]  atenolol-chlorthalidone (TENORETIC) 50-25 MG tablet Take 1 tablet by mouth daily. 10/19/19  Yes [provider]  GARLIC PO Take 1 tablet by mouth daily.   Yes [provider]  losartan (COZAAR) 100 MG tablet Take 100 mg by mouth daily. 12/06/19  Yes [provider]  metFORMIN (GLUCOPHAGE-XR) 500 MG 24 hr tablet Take 1,000 mg by mouth 2 (two) times daily. 11/11/19  Yes [provider]  Multiple Vitamin (MULTIVITAMIN WITH MINERALS) TABS tablet Take 1 tablet by mouth daily.   Yes [provider]  naproxen sodium (ALEVE) 220 MG tablet Take 220 mg by mouth 2 (two) times daily as needed (headache/pain).   Yes [provider]  VITAMIN E PO Take 1 tablet by mouth daily.   Yes [provider]    Physical Exam:  Constitutional: Obese male who appears in no acute distress at this time. Vitals:   01/03/20 1100 01/03/20 1118 01/03/20 1130 01/03/20 1145  BP: (!) 144/69  136/61 (!) 134/59  Pulse: 66  (!) 58 (!) 57  Resp: 19  17 16   Temp:      TempSrc:      SpO2: 97% 95% 94% 96%  Weight:      Height:       Eyes: PERRL, lids and conjunctivae normal ENMT: Mucous membranes are moist. Posterior pharynx clear of any exudate or lesions.   Neck: normal, supple, no masses, no thyromegaly Respiratory: Normal respiratory effort with bibasilar crackles appreciated.  Patient able to talk in complete in full sentences. Cardiovascular: Regular rate and rhythm, 1/6 systolic murmur.  At least +2 pitting edema of the left lower extremity and 1+ of the right lower extremity edema. 2+ pedal pulses. No carotid bruits.  Abdomen: no tenderness, no masses palpated. No hepatosplenomegaly. Bowel sounds positive.  Foley has blood present. Musculoskeletal: no clubbing / cyanosis.  No scoliosis appreciated of the spine.  No acute drop-offs appreciated. Skin: no rashes, lesions, ulcers. No  induration Neurologic: CN 2-12 grossly intact. Sensation intact, DTR normal. Strength 5/5 in all 4.  Psychiatric: Normal judgment and insight. Alert and oriented x 3. Normal mood.     Labs on Admission: I have personally reviewed following labs and imaging studies  CBC: Recent Labs  Lab 01/03/20 1036  WBC 11.8*  NEUTROABS 9.2*  HGB 11.7*  HCT 35.6*  MCV 93.9  PLT 638   Basic Metabolic Panel: Recent Labs  Lab 01/03/20 1036  NA 135  K 4.4  CL 98  CO2 23  GLUCOSE 130*  BUN 63*  CREATININE 5.07*  CALCIUM 9.0   GFR: Estimated Creatinine Clearance: 13.3 mL/min (A) (by C-G formula based on SCr of 5.07 mg/dL (H)). Liver Function Tests: No results for input(s): AST, ALT, ALKPHOS, BILITOT, PROT, ALBUMIN in the last 168 hours. No results for input(s): LIPASE, AMYLASE in the last 168 hours. No results for input(s): AMMONIA in the last 168 hours. Coagulation Profile: No results for input(s): INR, PROTIME in the  last 168 hours. Cardiac Enzymes: No results for input(s): CKTOTAL, CKMB, CKMBINDEX, TROPONINI in the last 168 hours. BNP (last 3 results) No results for input(s): PROBNP in the last 8760 hours. HbA1C: No results for input(s): HGBA1C in the last 72 hours. CBG: No results for input(s): GLUCAP in the last 168 hours. Lipid Profile: No results for input(s): CHOL, HDL, LDLCALC, TRIG, CHOLHDL, LDLDIRECT in the last 72 hours. Thyroid Function Tests: No results for input(s): TSH, T4TOTAL, FREET4, T3FREE, THYROIDAB in the last 72 hours. Anemia Panel: No results for input(s): VITAMINB12, FOLATE, FERRITIN, TIBC, IRON, RETICCTPCT in the last 72 hours. Urine analysis:    Component Value Date/Time   COLORURINE YELLOW 01/03/2020 1036   APPEARANCEUR CLEAR 01/03/2020 1036   LABSPEC 1.011 01/03/2020 1036   PHURINE 5.0 01/03/2020 1036   GLUCOSEU NEGATIVE 01/03/2020 1036   HGBUR LARGE (A) 01/03/2020 1036   BILIRUBINUR NEGATIVE 01/03/2020 1036   KETONESUR NEGATIVE 01/03/2020 1036    PROTEINUR NEGATIVE 01/03/2020 1036   NITRITE NEGATIVE 01/03/2020 1036   LEUKOCYTESUR TRACE (A) 01/03/2020 1036   Sepsis Labs: No results found for this or any previous visit (from the past 240 hour(s)).   Radiological Exams on Admission: CT Head Wo Contrast  Result Date: 01/03/2020 CLINICAL DATA:  Head trauma EXAM: CT HEAD WITHOUT CONTRAST TECHNIQUE: Contiguous axial images were obtained from the base of the skull through the vertex without intravenous contrast. COMPARISON:  11/16/2003 FINDINGS: Brain: No evidence of acute infarction, hemorrhage, hydrocephalus, extra-axial collection or mass lesion/mass effect. Periventricular and deep white matter hypodensity. Vascular: No hyperdense vessel or unexpected calcification. Skull: Normal. Negative for fracture or focal lesion. Sinuses/Orbits: No acute finding. 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 IMPRESSION: No acute intracranial pathology. Small-vessel white matter disease. Electronically Signed   By: Eddie Candle M.D.   On: 01/03/2020 09:31   CT Lumbar Spine Wo Contrast  Result Date: 01/03/2020 CLINICAL DATA:  Low back pain after trauma. EXAM: CT LUMBAR SPINE WITHOUT CONTRAST TECHNIQUE: Multidetector CT imaging of the lumbar spine was performed without intravenous contrast administration. Multiplanar CT image reconstructions were also generated. COMPARISON:  None. FINDINGS: Segmentation: Normal appearing lumbosacral junction but when numbering in this fashion there are ribs at L1. Without thoracic numbering, this is the employed numbering scheme. Alignment: Marked levoscoliosis.  Grade 1 anterolisthesis at L4-5. Vertebrae: No acute fracture. Paraspinal and other soft tissues: Marked distension of the bladder bilateral hydroureteronephrosis. Numerous calculi layering in the bladder. There is also left nephrolithiasis with stone measuring 17 mm. Disc levels: Diffuse facet osteoarthritis  with bulky right-sided spurring at the thoracolumbar junction and bilateral spurring at L3-4 and below. There are bridging intervertebral osteophytes at L4-5 and L5-S1. Right-sided foraminal impingement at L1-2 to L3-4 due to facet spurring and disc narrowing. High-grade spinal stenosis, especially towards the right, seen at L3-4. IMPRESSION: 1. No acute fracture. 2. Scoliosis with advanced degenerative disease. Spinal stenosis is high-grade at L3-4. 3. Over distended bladder with bilateral hydroureteronephrosis. There are numerous calculi layering within the bladder and a large left renal calculus. Electronically Signed   By: Monte Fantasia M.D.   On: 01/03/2020 09:38   MR HIP LEFT WO CONTRAST  Result Date: 01/03/2020 CLINICAL DATA:  Left hip pain since a fall 2 days ago. Initial encounter. EXAM: MR OF THE LEFT HIP WITHOUT CONTRAST TECHNIQUE: Multiplanar, multisequence MR imaging was performed. No intravenous contrast was administered. COMPARISON:  Plain films of the left hip today.  FINDINGS: Bones: There is no fracture or worrisome lesion. Small sclerotic focus in the superior margin of the left femoral neck seen on plain films has benign features and may be an enchondroma. No worrisome marrow lesion is identified. There is mild degenerative disease about the hips. Moderate SI joint osteoarthritis is also seen. Convex left lumbar curvature may be positional. Articular cartilage and labrum Articular cartilage:  Mildly degenerated. Labrum:  Appears intact. Joint or bursal effusion Joint effusion:  None. Bursae: Negative. Muscles and tendons Muscles and tendons:  Intact. Other findings Miscellaneous: The prostate is visualized in the coronal plane only and measures approximately 8 cm craniocaudal by 8 cm transverse. Foley catheter is in place in the urinary bladder. There is air in the bladder likely from catheterization. Walls of the bladder are thickened. IMPRESSION: Negative for fracture. No acute abnormality.  Small sclerotic focus in the superior aspect of the left femoral neck on plain films has benign features and may be a enchondroma. No follow-up imaging is recommended. Massive prostatomegaly. Although the bladder is incompletely distended, its walls are markedly thickened compatible with bladder outlet obstruction and/or cystitis. Convex left lumbar curvature is partially imaged and may be positional. Electronically Signed   By: Inge Rise M.D.   On: 01/03/2020 12:40   DG Knee Complete 4 Views Left  Result Date: 01/03/2020 CLINICAL DATA:  Fall 2 days ago with left knee pain, initial encounter EXAM: LEFT KNEE - COMPLETE 4+ VIEW COMPARISON:  None. FINDINGS: Tricompartmental degenerative changes are noted. These are most noted in the medial joint space. No joint effusion is seen. No acute fracture or dislocation is seen. IMPRESSION: Degenerative change without acute abnormality. Electronically Signed   By: Inez Catalina M.D.   On: 01/03/2020 09:15   DG Hip Unilat W or Wo Pelvis 2-3 Views Left  Addendum Date: 01/03/2020   ADDENDUM REPORT: 01/03/2020 09:44 ADDENDUM: As additional information on the current study there is sclerosis along the superior margin of the LEFT femoral neck, interrupting trabecular pattern,, this is of uncertain significance. This may represent a stress fracture or nondisplaced fracture though could also represent a synovial herniation pit. Based on the limitations of the current exam if there is high clinical suspicion for fracture CT or MRI assessment may be helpful. These results were called by telephone at the time of interpretation on 01/03/2020 at 9:43 am to provider Dr Percell Miller, who verbally acknowledged these results. Electronically Signed   By: Zetta Bills M.D.   On: 01/03/2020 09:44   Result Date: 01/03/2020 CLINICAL DATA:  Fall 2 days ago evaluate for fracture EXAM: DG HIP (WITH OR WITHOUT PELVIS) 2-3V LEFT COMPARISON:  None FINDINGS: Degenerative changes about the hips.  Study limited by patient body habitus without sign of fracture or dislocation. Marked degenerative changes in the lumbar spine are incidentally noted. No sign of pelvic fracture. IMPRESSION: Degenerative changes without signs of fracture or dislocation. Electronically Signed: By: Zetta Bills M.D. On: 01/03/2020 09:14      Assessment/Plan  Acute renal failure Renal retention with bilateral hydroureteronephrosis Prostatomegaly: Acute.  Patient present with complaints of back pain.  Labs significant for creatinine 5.07 with BUN 63.  Imaging studies revealed acute urinary retention with prostamegaly and bilateral hydroureteronephrosis.  Since Foley catheter in place draining over 4.5 L of urine patient back pain has resolved. -Admit to a MedSurg bed -Hold nephrotoxic agents -Continue Foley catheter -Continue IV fluids -Order placed to request records from Dr. Loreen Freud is to see last  creatinine in February. -Start Flomax and Proscar -Dr. Abner Greenspan of urology was consulted, will follow for any further recommendations  Urinary tract infection with hematuria: Acute.  Urinalysis was positive for large hemoglobin, trace leukocytes, and many bacteria.  Patient had been empirically started on Rocephin. -Follow-up urine culture -Continue empiric antibiotics of Rocephin  Bladder stones/left renal nephrolithiasis: As seen on CT imaging. -Per urology  Leukocytosis: Acute WBC elevated 11.8.  Suspect secondary to above. -Recheck CBC in a.m.  Normocytic anemia: Acute. Hemoglobin 11.7 g/dL.  Patient with hematuria noted on physical exam.   -Continue to monitor H&H  Fall: Prior to arrival.  Imaging studies did not show any acute fractures. -Would benefit from a PT consult once patient's acute symptoms improve  Essential hypertension: Blood pressures currently stable since Foley catheter placed.  Home blood pressure medications include atenolol -chlorthalidone 50-25 mg daily and losartan 100 mg  daily. -Hold diuretics -Can continue atenolol as tolerated  Diabetes mellitus type 2, well controlled: Home medications include Metformin 1000 mg twice daily.  Unsure of his last hemoglobin A1c. -Hypoglycemic protocols -Check hemoglobin A1c -Hold Metformin due to acute renal failure  obesity: BMI 39.97 kg/m.  DVT prophylaxis: Heparin Code Status: Full Family Communication: Daughter updated at bedside Disposition Plan: Possible discharge home in 2-3 days Consults called: Urology  Admission status: Inpatient status requiring more than 2 midnight stay for IV fluid rehydration and monitoring of acute renal failure  Norval Morton MD [facility] Pager [phone]   If 7PM-7AM, please contact night-coverage [email or web address] Password [email or web address]  01/03/2020, 1:38 PM

## 2020-01-03 NOTE — Plan of Care (Signed)
POC initiated 

## 2020-01-03 NOTE — ED Notes (Signed)
Patient transported to MRI via stretcher.

## 2020-01-03 NOTE — ED Notes (Signed)
Handoff given to [place], [place]

## 2020-01-03 NOTE — Consult Note (Addendum)
Urology Consult   Physician requesting consult: Dr. Tamala Julian  Reason for consult: Urinary retention, bilateral hydronephrosis, bladder outlet obstruction, gross hematuria, renal failure  History of Present Illness: Carlos Alvarez is a 74 y.o. was admitted today after suffering a fall.  He was found to be in renal failure.  His creatinine was 5.07 from an unknown baseline.  A CT of the lumbar spine as well as an MRI of the hip showed no acute fracture however did show an over distended bladder with bilateral hydroureter nephrosis and prostatomegaly.  There found to be numerous calculi layering within the bladder as well as a large left renal calculus.  A Foley catheter was placed with immediate return of 5 L of clear yellow urine which is subsequently turned to a light pink color.  He states he has a past urologic history of urolithiasis that required treatment approximately 40 years ago.  He has not followed up with urology since that time.  Prior to this episode he denies urinary retention.  He denies a sensation by incomplete bladder emptying.  He denies dysuria or hematuria prior to Foley catheter placement.  Over the past couple weeks he did notice increased urinary frequency as well as urgency.  He states he has a fair flow of stream.  He has noticed lower back pain but did not experience any flank pain or pelvic pain.   Past Medical History:  Diagnosis Date  . Diabetes mellitus without complication ([place])   . Hypertension   . Scoliosis     Past Surgical History:  Procedure Laterality Date  . KIDNEY SURGERY      Medications:  Home meds:  No current facility-administered medications on file prior to encounter.   Current Outpatient Medications on File Prior to Encounter  Medication Sig Dispense Refill  . acetaminophen (TYLENOL) 500 MG tablet Take 500 mg by mouth every 6 (six) hours as needed for mild pain or headache.    Marland Kitchen atenolol-chlorthalidone (TENORETIC) 50-25 MG tablet Take 1 tablet  by mouth daily.    Marland Kitchen GARLIC PO Take 1 tablet by mouth daily.    Marland Kitchen losartan (COZAAR) 100 MG tablet Take 100 mg by mouth daily.    . metFORMIN (GLUCOPHAGE-XR) 500 MG 24 hr tablet Take 1,000 mg by mouth 2 (two) times daily.    . Multiple Vitamin (MULTIVITAMIN WITH MINERALS) TABS tablet Take 1 tablet by mouth daily.    . naproxen sodium (ALEVE) 220 MG tablet Take 220 mg by mouth 2 (two) times daily as needed (headache/pain).    Marland Kitchen VITAMIN E PO Take 1 tablet by mouth daily.       Scheduled Meds: . heparin  5,000 Units Subcutaneous Q8H  . sodium chloride flush  3 mL Intravenous Q12H   Continuous Infusions: . sodium chloride 100 mL/hr at 01/03/20 1800   PRN Meds:.acetaminophen **OR** acetaminophen, albuterol, ondansetron **OR** ondansetron (ZOFRAN) IV  Allergies: No Known Allergies  History reviewed. No pertinent family history.  Social History:  reports that he has never smoked. He has never used smokeless tobacco. He reports previous alcohol use. He reports previous drug use.  ROS: A complete review of systems was performed.  All systems are negative except for pertinent findings as noted.  Physical Exam:  Vital signs in last 24 hours: Temp:  [98.2 F (36.8 C)] 98.2 F (36.8 C) (09/09 1703) Pulse Rate:  [56-69] 60 (09/09 1703) Resp:  [16-20] 18 (09/09 1703) BP: (134-181)/(58-82) 140/62 (09/09 1703) SpO2:  [94 %-98 %]  98 % (09/09 1703) Weight:  [102.2 kg-104.3 kg] 102.2 kg (09/09 1703) Constitutional:  Alert and oriented, No acute distress Cardiovascular: Regular rate and rhythm Respiratory: Normal respiratory effort, Lungs clear bilaterally GI: Abdomen is obese, soft, nontender, nondistended, no abdominal masses Genitourinary: No CVAT. Normal male phallus, testes are descended bilaterally and non-tender and without masses, scrotum is normal in appearance without lesions or masses, perineum is normal on inspection. Rectal: Normal sphincter tone, no rectal masses, prostate is non  tender and without nodularity. Prostate size is estimated to be over 50 cc, incompletely assessed due to body habitus Neurologic: Grossly intact, no focal deficits Psychiatric: Normal mood and affect  Laboratory Data:  Recent Labs    01/03/20 1036  WBC 11.8*  HGB 11.7*  HCT 35.6*  PLT 287    Recent Labs    01/03/20 1036  NA 135  K 4.4  CL 98  GLUCOSE 130*  BUN 63*  CALCIUM 9.0  CREATININE 5.07*     Results for orders placed or performed during the hospital encounter of 01/03/20 (from the past 24 hour(s))  Basic metabolic panel     Status: Abnormal   Collection Time: 01/03/20 10:36 AM  Result Value Ref Range   Sodium 135 135 - 145 mmol/L   Potassium 4.4 3.5 - 5.1 mmol/L   Chloride 98 98 - 111 mmol/L   CO2 23 22 - 32 mmol/L   Glucose, Bld 130 (H) 70 - 99 mg/dL   BUN 63 (H) 8 - 23 mg/dL   Creatinine, Ser 5.07 (H) 0.61 - 1.24 mg/dL   Calcium 9.0 8.9 - 10.3 mg/dL   GFR calc non Af Amer 10 (L) >60 mL/min   GFR calc Af Amer 12 (L) >60 mL/min   Anion gap 14 5 - 15  CBC with Differential     Status: Abnormal   Collection Time: 01/03/20 10:36 AM  Result Value Ref Range   WBC 11.8 (H) 4.0 - 10.5 K/uL   RBC 3.79 (L) 4.22 - 5.81 MIL/uL   Hemoglobin 11.7 (L) 13.0 - 17.0 g/dL   HCT 35.6 (L) 39 - 52 %   MCV 93.9 80.0 - 100.0 fL   MCH 30.9 26.0 - 34.0 pg   MCHC 32.9 30.0 - 36.0 g/dL   RDW 13.2 11.5 - 15.5 %   Platelets 287 150 - 400 K/uL   nRBC 0.0 0.0 - 0.2 %   Neutrophils Relative % 78 %   Neutro Abs 9.2 (H) 1.7 - 7.7 K/uL   Lymphocytes Relative 10 %   Lymphs Abs 1.1 0.7 - 4.0 K/uL   Monocytes Relative 12 %   Monocytes Absolute 1.4 (H) 0 - 1 K/uL   Eosinophils Relative 0 %   Eosinophils Absolute 0.1 0 - 0 K/uL   Basophils Relative 0 %   Basophils Absolute 0.0 0 - 0 K/uL   Immature Granulocytes 0 %   Abs Immature Granulocytes 0.04 0.00 - 0.07 K/uL  Urinalysis, Routine w reflex microscopic Urine, Catheterized     Status: Abnormal   Collection Time: 01/03/20 10:36 AM   Result Value Ref Range   Color, Urine YELLOW YELLOW   APPearance CLEAR CLEAR   Specific Gravity, Urine 1.011 1.005 - 1.030   pH 5.0 5.0 - 8.0   Glucose, UA NEGATIVE NEGATIVE mg/dL   Hgb urine dipstick LARGE (A) NEGATIVE   Bilirubin Urine NEGATIVE NEGATIVE   Ketones, ur NEGATIVE NEGATIVE mg/dL   Protein, ur NEGATIVE NEGATIVE mg/dL   Nitrite  NEGATIVE NEGATIVE   Leukocytes,Ua TRACE (A) NEGATIVE   Bacteria, UA MANY (A) NONE SEEN  Na and K (sodium & potassium), rand urine     Status: None   Collection Time: 01/03/20 10:36 AM  Result Value Ref Range   Sodium, Ur 41 mmol/L   Potassium Urine 35 mmol/L  Creatinine, urine, random     Status: None   Collection Time: 01/03/20 10:36 AM  Result Value Ref Range   Creatinine, Urine 121.39 mg/dL  Hemoglobin A1c     Status: Abnormal   Collection Time: 01/03/20 10:36 AM  Result Value Ref Range   Hgb A1c MFr Bld 5.8 (H) 4.8 - 5.6 %   Mean Plasma Glucose 119.76 mg/dL  CK     Status: None   Collection Time: 01/03/20 10:36 AM  Result Value Ref Range   Total CK 105 49.0 - 397.0 U/L  SARS Coronavirus 2 by RT PCR (hospital order, performed in [place] hospital lab) Nasopharyngeal Nasopharyngeal Swab     Status: None   Collection Time: 01/03/20  1:58 PM   Specimen: Nasopharyngeal Swab  Result Value Ref Range   SARS Coronavirus 2 NEGATIVE NEGATIVE   Recent Results (from the past 240 hour(s))  SARS Coronavirus 2 by RT PCR (hospital order, performed in [place] hospital lab) Nasopharyngeal Nasopharyngeal Swab     Status: None   Collection Time: 01/03/20  1:58 PM   Specimen: Nasopharyngeal Swab  Result Value Ref Range Status   SARS Coronavirus 2 NEGATIVE NEGATIVE Final    Comment: (NOTE) SARS-CoV-2 target nucleic acids are NOT DETECTED.  The SARS-CoV-2 RNA is generally detectable in upper and lower respiratory specimens during the acute phase of infection. The lowest concentration of SARS-CoV-2 viral copies this assay can detect is  250 copies / mL. A negative result does not preclude SARS-CoV-2 infection and should not be used as the sole basis for treatment or other patient management decisions.  A negative result may occur with improper specimen collection / handling, submission of specimen other than nasopharyngeal swab, presence of viral mutation(s) within the areas targeted by this assay, and inadequate number of viral copies (<250 copies / mL). A negative result must be combined with clinical observations, patient history, and epidemiological information.  Fact Sheet for Patients:   StrictlyIdeas.no  Fact Sheet for Healthcare Providers: BankingDealers.co.za  This test is not yet approved or  cleared by the [place] FDA and has been authorized for detection and/or diagnosis of SARS-CoV-2 by FDA under an Emergency Use Authorization (EUA).  This EUA will remain in effect (meaning this test can be used) for the duration of the COVID-19 declaration under Section 564(b)(1) of the Act, 21 U.S.C. section 360bbb-3(b)(1), unless the authorization is terminated or revoked sooner.  Performed at [facility], [place] [address] [place],  [address]     Renal Function: Recent Labs    01/03/20 1036  CREATININE 5.07*   Estimated Creatinine Clearance: 13.1 mL/min (A) (by C-G formula based on SCr of 5.07 mg/dL (H)).  Radiologic Imaging: CT Head Wo Contrast  Result Date: 01/03/2020 CLINICAL DATA:  Head trauma EXAM: CT HEAD WITHOUT CONTRAST TECHNIQUE: Contiguous axial images were obtained from the base of the skull through the vertex without intravenous contrast. COMPARISON:  11/16/2003 FINDINGS: Brain: No evidence of acute infarction, hemorrhage, hydrocephalus, extra-axial collection or mass lesion/mass effect. Periventricular and deep white matter hypodensity. Vascular: No hyperdense vessel or unexpected calcification. Skull: Normal. Negative  for fracture or focal lesion.  Sinuses/Orbits: No acute finding. 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 IMPRESSION: No acute intracranial pathology. Small-vessel white matter disease. Electronically Signed   By: Eddie Candle M.D.   On: 01/03/2020 09:31   CT Lumbar Spine Wo Contrast  Result Date: 01/03/2020 CLINICAL DATA:  Low back pain after trauma. EXAM: CT LUMBAR SPINE WITHOUT CONTRAST TECHNIQUE: Multidetector CT imaging of the lumbar spine was performed without intravenous contrast administration. Multiplanar CT image reconstructions were also generated. COMPARISON:  None. FINDINGS: Segmentation: Normal appearing lumbosacral junction but when numbering in this fashion there are ribs at L1. Without thoracic numbering, this is the employed numbering scheme. Alignment: Marked levoscoliosis.  Grade 1 anterolisthesis at L4-5. Vertebrae: No acute fracture. Paraspinal and other soft tissues: Marked distension of the bladder bilateral hydroureteronephrosis. Numerous calculi layering in the bladder. There is also left nephrolithiasis with stone measuring 17 mm. Disc levels: Diffuse facet osteoarthritis with bulky right-sided spurring at the thoracolumbar junction and bilateral spurring at L3-4 and below. There are bridging intervertebral osteophytes at L4-5 and L5-S1. Right-sided foraminal impingement at L1-2 to L3-4 due to facet spurring and disc narrowing. High-grade spinal stenosis, especially towards the right, seen at L3-4. IMPRESSION: 1. No acute fracture. 2. Scoliosis with advanced degenerative disease. Spinal stenosis is high-grade at L3-4. 3. Over distended bladder with bilateral hydroureteronephrosis. There are numerous calculi layering within the bladder and a large left renal calculus. Electronically Signed   By: Monte Fantasia M.D.   On: 01/03/2020 09:38   MR HIP LEFT WO CONTRAST  Result Date: 01/03/2020 CLINICAL DATA:  Left hip pain since  a fall 2 days ago. Initial encounter. EXAM: MR OF THE LEFT HIP WITHOUT CONTRAST TECHNIQUE: Multiplanar, multisequence MR imaging was performed. No intravenous contrast was administered. COMPARISON:  Plain films of the left hip today. FINDINGS: Bones: There is no fracture or worrisome lesion. Small sclerotic focus in the superior margin of the left femoral neck seen on plain films has benign features and may be an enchondroma. No worrisome marrow lesion is identified. There is mild degenerative disease about the hips. Moderate SI joint osteoarthritis is also seen. Convex left lumbar curvature may be positional. Articular cartilage and labrum Articular cartilage:  Mildly degenerated. Labrum:  Appears intact. Joint or bursal effusion Joint effusion:  None. Bursae: Negative. Muscles and tendons Muscles and tendons:  Intact. Other findings Miscellaneous: The prostate is visualized in the coronal plane only and measures approximately 8 cm craniocaudal by 8 cm transverse. Foley catheter is in place in the urinary bladder. There is air in the bladder likely from catheterization. Walls of the bladder are thickened. IMPRESSION: Negative for fracture. No acute abnormality. Small sclerotic focus in the superior aspect of the left femoral neck on plain films has benign features and may be a enchondroma. No follow-up imaging is recommended. Massive prostatomegaly. Although the bladder is incompletely distended, its walls are markedly thickened compatible with bladder outlet obstruction and/or cystitis. Convex left lumbar curvature is partially imaged and may be positional. Electronically Signed   By: Inge Rise M.D.   On: 01/03/2020 12:40   DG Knee Complete 4 Views Left  Result Date: 01/03/2020 CLINICAL DATA:  Fall 2 days ago with left knee pain, initial encounter EXAM: LEFT KNEE - COMPLETE 4+ VIEW COMPARISON:  None. FINDINGS: Tricompartmental degenerative changes are noted. These are most noted in the medial joint  space. No joint effusion is seen. No acute fracture or dislocation is seen.  IMPRESSION: Degenerative change without acute abnormality. Electronically Signed   By: Inez Catalina M.D.   On: 01/03/2020 09:15   DG Hip Unilat W or Wo Pelvis 2-3 Views Left  Addendum Date: 01/03/2020   ADDENDUM REPORT: 01/03/2020 09:44 ADDENDUM: As additional information on the current study there is sclerosis along the superior margin of the LEFT femoral neck, interrupting trabecular pattern,, this is of uncertain significance. This may represent a stress fracture or nondisplaced fracture though could also represent a synovial herniation pit. Based on the limitations of the current exam if there is high clinical suspicion for fracture CT or MRI assessment may be helpful. These results were called by telephone at the time of interpretation on 01/03/2020 at 9:43 am to provider Dr Percell Miller, who verbally acknowledged these results. Electronically Signed   By: Zetta Bills M.D.   On: 01/03/2020 09:44   Result Date: 01/03/2020 CLINICAL DATA:  Fall 2 days ago evaluate for fracture EXAM: DG HIP (WITH OR WITHOUT PELVIS) 2-3V LEFT COMPARISON:  None FINDINGS: Degenerative changes about the hips. Study limited by patient body habitus without sign of fracture or dislocation. Marked degenerative changes in the lumbar spine are incidentally noted. No sign of pelvic fracture. IMPRESSION: Degenerative changes without signs of fracture or dislocation. Electronically Signed: By: Zetta Bills M.D. On: 01/03/2020 09:14    I independently reviewed the above imaging studies.  Impression: 1. Urinary retention: CT lumbar and MRI of the pelvis on 01/03/2020 demonstrated an over distended bladder with bilateral hydroureteronephrosis.  Foley catheter placed on 01/03/2020 with immediate return of 5 L of clear yellow urine. 2. Bilateral hydroureteronephrosis: Likely due to bladder outlet obstruction from enlarged prostate.  3. Bladder outlet obstruction:  Prostatomegaly seen on imaging 01/03/2020 4. Acute renal failure: Likely postobstructive given 5 L of urine output after Foley catheter placed on 01/03/2020.  Creatinine 5.07 on 01/03/2020 from an unknown baseline. 5. Gross hematuria: Noticed after 5 L of clear yellow urine drained.  Likely related to indwelling Foley catheter placement and postobstructive diuresis. 6. Large left renal calculus: Identified on CT lumbar spine 01/03/2020 7. Bladder stones: Identified on CT lumbar spine 01/03/2020  Plan: -Leave Foley catheter in place for at least 7 days given significant over distention.  Allow for bladder rest.  Patient will follow up in the office for Foley catheter management including possible void trial. -Monitor for postobstructive diuresis.  Encouraged him to drink fluids.  Continue IV fluids. -Trend creatinine until it nadirs. -We will obtain CT abdomen pelvis in approximately 48 hours to ensure resolution of bilateral hydroureteronephrosis and to adequately image the known left renal calculus and estimated size of the prostate. -Start Flomax 0.4 mg daily. -Start finasteride  -Gross hematuria likely related to indwelling Foley catheter.  Irrigate catheter as needed for clots or decreased drainage. -He will likely require surgical intervention for his enlarged prostate contributing to bladder outlet obstruction.  We will plan for concomitant cystolitholapaxy/drainage of small bladder sotones.  He will also likely need intervention for his left renal calculus.  This will be done outpatient after he recovers from his inpatient admission. -Messaged office schedulers to arrange for followup  Matt R. Demetrius Mahler MD [facility]  Pager: [phone]   CC: Dr. Tamala Julian

## 2020-01-03 NOTE — ED Triage Notes (Signed)
Arrives via [facility] from home, C/C mechanical fall yesterday, now complains of back, L hip and L knee pain.  Usually walks with a cane.

## 2020-01-03 NOTE — ED Provider Notes (Signed)
[facility] DEPT Provider Note   CSN: [id number] Arrival date & time: 01/03/20  0801     History Chief Complaint  Patient presents with  . Fall  . Emesis    Carlos Alvarez is a 74 y.o. male with a history of hypertension, scoliosis, diabetes, obesity, severe osteoarthritis of the left knee, presented to emergency department with fall and left hip and knee pain.  The patient normally walks with a cane due to his osteoarthritis of his left knee.  He reports yesterday he was attempting to climb a curb to get into his workplace, lost his balance and fell off the curb.  He struck his forehead on the pavement but is not sure how else he fell.  He did not lose consciousness.  He is not on blood thinners.  He says is not been able to bear weight or walk without assistance for the past 24 hours.  He describes significant pain in his lower back that radiates towards his left hip, and possibly is in his left knee as well.  It is worse with bearing weight.  It is better when laying on his left side.  He denies any numbness in his foot.  He denies any worsening headache, blurred vision, nausea or vomiting.  He tried taking Aleve for pain but it did not help.  HPI     Past Medical History:  Diagnosis Date  . Diabetes mellitus without complication ([place])   . Hypertension   . Scoliosis     Patient Active Problem List   Diagnosis Date Noted  . ARF (acute renal failure) ([place]) 01/03/2020    Past Surgical History:  Procedure Laterality Date  . KIDNEY SURGERY         History reviewed. No pertinent family history.  Social History   Tobacco Use  . Smoking status: Never Smoker  . Smokeless tobacco: Never Used  Substance Use Topics  . Alcohol use: Not Currently  . Drug use: Not Currently    Home Medications Prior to Admission medications   Medication Sig Start Date End Date Taking? Authorizing Provider  acetaminophen (TYLENOL) 500 MG tablet Take 500 mg by  mouth every 6 (six) hours as needed for mild pain or headache.   Yes [provider]  atenolol-chlorthalidone (TENORETIC) 50-25 MG tablet Take 1 tablet by mouth daily. 10/19/19  Yes [provider]  GARLIC PO Take 1 tablet by mouth daily.   Yes [provider]  losartan (COZAAR) 100 MG tablet Take 100 mg by mouth daily. 12/06/19  Yes [provider]  metFORMIN (GLUCOPHAGE-XR) 500 MG 24 hr tablet Take 1,000 mg by mouth 2 (two) times daily. 11/11/19  Yes [provider]  Multiple Vitamin (MULTIVITAMIN WITH MINERALS) TABS tablet Take 1 tablet by mouth daily.   Yes [provider]  naproxen sodium (ALEVE) 220 MG tablet Take 220 mg by mouth 2 (two) times daily as needed (headache/pain).   Yes [provider]  VITAMIN E PO Take 1 tablet by mouth daily.   Yes [provider]    Allergies    Patient has no known allergies.  Review of Systems   Review of Systems  Constitutional: Negative for chills and fever.  Eyes: Negative for pain and visual disturbance.  Respiratory: Negative for cough and shortness of breath.   Cardiovascular: Negative for chest pain and palpitations.  Gastrointestinal: Negative for abdominal pain and vomiting.  Genitourinary: Negative for dysuria and hematuria.  Musculoskeletal: Positive for  arthralgias, back pain, gait problem and myalgias. Negative for neck pain and neck stiffness.  Skin: Negative for pallor and rash.  Neurological: Negative for seizures, syncope and headaches.  Psychiatric/Behavioral: Negative for agitation and confusion.  All other systems reviewed and are negative.   Physical Exam Updated Vital Signs BP (!) 142/58   Pulse 69   Temp 98.2 F (36.8 C) (Oral)   Resp 18   Ht 5\' 3"  (1.6 m)   Wt 104.3 kg   SpO2 97%   BMI 40.74 kg/m   Physical Exam Vitals and nursing note reviewed.  Constitutional:      Appearance: He is well-developed. He is obese.  HENT:     Head:  Normocephalic.     Comments: Small superficial abrasion to left eye Eyes:     Conjunctiva/sclera: Conjunctivae normal.     Pupils: Pupils are equal, round, and reactive to light.  Cardiovascular:     Rate and Rhythm: Normal rate and regular rhythm.     Pulses: Normal pulses.  Pulmonary:     Effort: Pulmonary effort is normal. No respiratory distress.     Breath sounds: Normal breath sounds.  Abdominal:     Palpations: Abdomen is soft.     Tenderness: There is no abdominal tenderness. There is no guarding.     Comments: Distended bladder  Musculoskeletal:     Cervical back: Neck supple.     Comments: Full ROM at bilateral hips without pain Minor left peripatellar effusion without focal patellar TTP or ttp of the fibular head on the left side No spinal midline ttp but exam limited by obesity  Skin:    General: Skin is warm and dry.  Neurological:     General: No focal deficit present.     Mental Status: He is alert and oriented to person, place, and time.     Cranial Nerves: No cranial nerve deficit.     Sensory: No sensory deficit.  Psychiatric:        Mood and Affect: Mood normal.        Behavior: Behavior normal.     ED Results / Procedures / Treatments   Labs (all labs ordered are listed, but only abnormal results are displayed) Labs Reviewed  BASIC METABOLIC PANEL - Abnormal; Notable for the following components:      Result Value   Glucose, Bld 130 (*)    BUN 63 (*)    Creatinine, Ser 5.07 (*)    GFR calc non Af Amer 10 (*)    GFR calc Af Amer 12 (*)    All other components within normal limits  CBC WITH DIFFERENTIAL/PLATELET - Abnormal; Notable for the following components:   WBC 11.8 (*)    RBC 3.79 (*)    Hemoglobin 11.7 (*)    HCT 35.6 (*)    Neutro Abs 9.2 (*)    Monocytes Absolute 1.4 (*)    All other components within normal limits  URINALYSIS, ROUTINE W REFLEX MICROSCOPIC - Abnormal; Notable for the following components:   Hgb urine dipstick LARGE (*)     Leukocytes,Ua TRACE (*)    Bacteria, UA MANY (*)    All other components within normal limits  URINE CULTURE  SARS CORONAVIRUS 2 BY RT PCR (HOSPITAL ORDER, [facility])  NA AND K (SODIUM & POTASSIUM), RAND UR  CREATININE, URINE, RANDOM  HEMOGLOBIN A1C  CK    EKG None  Radiology CT Head Wo Contrast  Result Date:  01/03/2020 CLINICAL DATA:  Head trauma EXAM: CT HEAD WITHOUT CONTRAST TECHNIQUE: Contiguous axial images were obtained from the base of the skull through the vertex without intravenous contrast. COMPARISON:  11/16/2003 FINDINGS: Brain: No evidence of acute infarction, hemorrhage, hydrocephalus, extra-axial collection or mass lesion/mass effect. Periventricular and deep white matter hypodensity. Vascular: No hyperdense vessel or unexpected calcification. Skull: Normal. Negative for fracture or focal lesion. Sinuses/Orbits: No acute finding. 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 IMPRESSION: No acute intracranial pathology. Small-vessel white matter disease. Electronically Signed   By: Eddie Candle M.D.   On: 01/03/2020 09:31   CT Lumbar Spine Wo Contrast  Result Date: 01/03/2020 CLINICAL DATA:  Low back pain after trauma. EXAM: CT LUMBAR SPINE WITHOUT CONTRAST TECHNIQUE: Multidetector CT imaging of the lumbar spine was performed without intravenous contrast administration. Multiplanar CT image reconstructions were also generated. COMPARISON:  None. FINDINGS: Segmentation: Normal appearing lumbosacral junction but when numbering in this fashion there are ribs at L1. Without thoracic numbering, this is the employed numbering scheme. Alignment: Marked levoscoliosis.  Grade 1 anterolisthesis at L4-5. Vertebrae: No acute fracture. Paraspinal and other soft tissues: Marked distension of the bladder bilateral hydroureteronephrosis. Numerous calculi layering in the bladder. There is also left  nephrolithiasis with stone measuring 17 mm. Disc levels: Diffuse facet osteoarthritis with bulky right-sided spurring at the thoracolumbar junction and bilateral spurring at L3-4 and below. There are bridging intervertebral osteophytes at L4-5 and L5-S1. Right-sided foraminal impingement at L1-2 to L3-4 due to facet spurring and disc narrowing. High-grade spinal stenosis, especially towards the right, seen at L3-4. IMPRESSION: 1. No acute fracture. 2. Scoliosis with advanced degenerative disease. Spinal stenosis is high-grade at L3-4. 3. Over distended bladder with bilateral hydroureteronephrosis. There are numerous calculi layering within the bladder and a large left renal calculus. Electronically Signed   By: Monte Fantasia M.D.   On: 01/03/2020 09:38   MR HIP LEFT WO CONTRAST  Result Date: 01/03/2020 CLINICAL DATA:  Left hip pain since a fall 2 days ago. Initial encounter. EXAM: MR OF THE LEFT HIP WITHOUT CONTRAST TECHNIQUE: Multiplanar, multisequence MR imaging was performed. No intravenous contrast was administered. COMPARISON:  Plain films of the left hip today. FINDINGS: Bones: There is no fracture or worrisome lesion. Small sclerotic focus in the superior margin of the left femoral neck seen on plain films has benign features and may be an enchondroma. No worrisome marrow lesion is identified. There is mild degenerative disease about the hips. Moderate SI joint osteoarthritis is also seen. Convex left lumbar curvature may be positional. Articular cartilage and labrum Articular cartilage:  Mildly degenerated. Labrum:  Appears intact. Joint or bursal effusion Joint effusion:  None. Bursae: Negative. Muscles and tendons Muscles and tendons:  Intact. Other findings Miscellaneous: The prostate is visualized in the coronal plane only and measures approximately 8 cm craniocaudal by 8 cm transverse. Foley catheter is in place in the urinary bladder. There is air in the bladder likely from catheterization. Walls  of the bladder are thickened. IMPRESSION: Negative for fracture. No acute abnormality. Small sclerotic focus in the superior aspect of the left femoral neck on plain films has benign features and may be a enchondroma. No follow-up imaging is recommended. Massive prostatomegaly. Although the bladder is incompletely distended, its walls are markedly thickened compatible with bladder outlet obstruction and/or cystitis. Convex left lumbar curvature is partially imaged and may be positional. Electronically Signed   By: Inge Rise  M.D.   On: 01/03/2020 12:40   DG Knee Complete 4 Views Left  Result Date: 01/03/2020 CLINICAL DATA:  Fall 2 days ago with left knee pain, initial encounter EXAM: LEFT KNEE - COMPLETE 4+ VIEW COMPARISON:  None. FINDINGS: Tricompartmental degenerative changes are noted. These are most noted in the medial joint space. No joint effusion is seen. No acute fracture or dislocation is seen. IMPRESSION: Degenerative change without acute abnormality. Electronically Signed   By: Inez Catalina M.D.   On: 01/03/2020 09:15   DG Hip Unilat W or Wo Pelvis 2-3 Views Left  Addendum Date: 01/03/2020   ADDENDUM REPORT: 01/03/2020 09:44 ADDENDUM: As additional information on the current study there is sclerosis along the superior margin of the LEFT femoral neck, interrupting trabecular pattern,, this is of uncertain significance. This may represent a stress fracture or nondisplaced fracture though could also represent a synovial herniation pit. Based on the limitations of the current exam if there is high clinical suspicion for fracture CT or MRI assessment may be helpful. These results were called by telephone at the time of interpretation on 01/03/2020 at 9:43 am to provider Dr Percell Miller, who verbally acknowledged these results. Electronically Signed   By: Zetta Bills M.D.   On: 01/03/2020 09:44   Result Date: 01/03/2020 CLINICAL DATA:  Fall 2 days ago evaluate for fracture EXAM: DG HIP (WITH OR WITHOUT  PELVIS) 2-3V LEFT COMPARISON:  None FINDINGS: Degenerative changes about the hips. Study limited by patient body habitus without sign of fracture or dislocation. Marked degenerative changes in the lumbar spine are incidentally noted. No sign of pelvic fracture. IMPRESSION: Degenerative changes without signs of fracture or dislocation. Electronically Signed: By: Zetta Bills M.D. On: 01/03/2020 09:14    Procedures Procedures (including critical care time)  Medications Ordered in ED Medications  heparin injection 5,000 Units (has no administration in time range)  sodium chloride flush (NS) 0.9 % injection 3 mL (has no administration in time range)  0.9 %  sodium chloride infusion (has no administration in time range)  acetaminophen (TYLENOL) tablet 650 mg (has no administration in time range)    Or  acetaminophen (TYLENOL) suppository 650 mg (has no administration in time range)  ondansetron (ZOFRAN) tablet 4 mg (has no administration in time range)    Or  ondansetron (ZOFRAN) injection 4 mg (has no administration in time range)  albuterol (PROVENTIL) (2.5 MG/3ML) 0.083% nebulizer solution 2.5 mg (has no administration in time range)  insulin aspart (novoLOG) injection 0-6 Units (has no administration in time range)  oxyCODONE (Oxy IR/ROXICODONE) immediate release tablet 5 mg (5 mg Oral Given 01/03/20 0826)  acetaminophen (TYLENOL) tablet 1,000 mg (1,000 mg Oral Given 01/03/20 0826)  cefTRIAXone (ROCEPHIN) 1 g in sodium chloride 0.9 % 100 mL IVPB (0 g Intravenous Stopped 01/03/20 1355)  sodium chloride 0.9 % bolus 1,000 mL (1,000 mLs Intravenous New Bag/Given 01/03/20 1402)    ED Course  I have reviewed the triage vital signs and the nursing notes.  Pertinent labs & imaging results that were available during my care of the patient were reviewed by me and considered in my medical decision making (see chart for details).   74 yo male presenting with mechanical fall yesterday while climbing curb.   He has a small abrasion to his forehead.  No evidence of C spine injury.  He has a difficult time describing to me where his pain is, either in his lower back or his left hip or both.  We will xray the hip and knee, and CT the lumbar spine.  We will also CT his head.   His left lower extremity is neurovascularly intact.  For pain I've ordered 1 g tylenol and 5 mg oxycodone.  He has bladder distension on exam - will check UA.  Clinical Course as of Jan 02 1429  Thu Jan 03, 2020  0919 No acute fx noted on xray imaging   [MT]  0936 IMPRESSION: No acute intracranial pathology. Small-vessel white matter disease.   [MT]  1011 No acute fx on CT L spine but noted bladder distension and bilateral hydroureteronephrosis.  Patient reports he urinates frequently but in small amounts, and occasionally notes he has had small involuntary voids (including today after xray).  Suspect he may have BPH or other cause of urinary hesitancy.  We discussed a foley catheter and he is amenable.  We'll place the catheter, check a UA and BMP level.  I've also ordered an MRI of the left hip   [MT]  1111 4L dark urine drained after foley catheter placed, with relief of patient's back pain.    [MT]  1300 Pt reports his pain has dramatically improved, "I feel great."  He has now put out an additional 1L urine to complete 5L urine output.  He has likely AKI (no baseline Cr, but he is not aware of underlying kidney disease, Cr is 5 today, with BUN 63).  He will need admission for repeat Cr testing, observation for possible post-obstructive diuresis syndrome.  I will treat empirically for UTI with 1 g Rocephin given his +bacteria and leuks in his UA.   [MT]  1302 Prostate is 8 cm, likely cause of his post-obstructive nephropathy    [MT]  1318 Signed out to hospitalist   [MT]    Clinical Course User Index [MT] Laurella Tull, Carola Rhine, MD    Final Clinical Impression(s) / ED Diagnoses Final diagnoses:  AKI (acute kidney  injury) [facility])  Bladder distension  Fall, initial encounter  Enlarged prostate    Rx / DC Orders ED Discharge Orders    None       Wyvonnia Dusky, MD 01/03/20 1430

## 2020-01-04 LAB — CBC
HCT: 32.7 % — ABNORMAL LOW (ref 39.0–52.0)
Hemoglobin: 10.9 g/dL — ABNORMAL LOW (ref 13.0–17.0)
MCH: 31.3 pg (ref 26.0–34.0)
MCHC: 33.3 g/dL (ref 30.0–36.0)
MCV: 94 fL (ref 80.0–100.0)
Platelets: 257 10*3/uL (ref 150–400)
RBC: 3.48 MIL/uL — ABNORMAL LOW (ref 4.22–5.81)
RDW: 13.2 % (ref 11.5–15.5)
WBC: 9.6 10*3/uL (ref 4.0–10.5)
nRBC: 0 % (ref 0.0–0.2)

## 2020-01-04 LAB — BASIC METABOLIC PANEL
Anion gap: 15 (ref 5–15)
BUN: 51 mg/dL — ABNORMAL HIGH (ref 8–23)
CO2: 26 mmol/L (ref 22–32)
Calcium: 8.8 mg/dL — ABNORMAL LOW (ref 8.9–10.3)
Chloride: 104 mmol/L (ref 98–111)
Creatinine, Ser: 2.85 mg/dL — ABNORMAL HIGH (ref 0.61–1.24)
GFR calc Af Amer: 24 mL/min — ABNORMAL LOW (ref >60)
GFR calc non Af Amer: 21 mL/min — ABNORMAL LOW (ref >60)
Glucose, Bld: 124 mg/dL — ABNORMAL HIGH (ref 70–99)
Potassium: 4 mmol/L (ref 3.5–5.1)
Sodium: 145 mmol/L (ref 135–145)

## 2020-01-04 LAB — URINE CULTURE: Culture: NO GROWTH

## 2020-01-04 MED ORDER — CHLORHEXIDINE GLUCONATE CLOTH 2 % EX PADS
6.0000 | MEDICATED_PAD | Freq: Every day | CUTANEOUS | Status: DC
  Administered 2020-01-04 – 2020-01-05 (×2): 6 via TOPICAL

## 2020-01-04 MED ORDER — TROLAMINE SALICYLATE 10 % EX CREA: TOPICAL_CREAM | Freq: Two times a day (BID) | CUTANEOUS | Status: DC | PRN

## 2020-01-04 MED ORDER — AMLODIPINE BESYLATE 5 MG PO TABS
5.0000 mg | ORAL_TABLET | Freq: Every day | ORAL | Status: DC
  Administered 2020-01-04 – 2020-01-06 (×3): 5 mg via ORAL
  Filled 2020-01-04 (×3): qty 1

## 2020-01-04 MED ORDER — MUSCLE RUB 10-15 % EX CREA
TOPICAL_CREAM | CUTANEOUS | Status: DC | PRN
  Administered 2020-01-04: 1 via TOPICAL
  Filled 2020-01-04: qty 85

## 2020-01-04 NOTE — Evaluation (Signed)
Physical Therapy Evaluation Patient Details Name: Carlos Alvarez MRN: [id number] DOB: 07/20/1945 Today's Date: 01/04/2020   History of Present Illness  Pt admitted s/p fall and dx with acute renal failure, renal retention, UTI.  Pt with hx of DM, scoliosis, severe L knee OA, and obesity  Clinical Impression  Pt admitted as above and presenting with functional mobility limitations 2* generalized weakness, severe L knee OA and related pain, balance deficits, obesity and limited endurance.  Pt hopes to progress to dc home with family assist and would benefit from follow up PT to further address deficits.  Pt states he would prefer to go to OP PT rather than have HHPT.    Follow Up Recommendations Outpatient PT    Equipment Recommendations  Rolling walker with 5" wheels (youth level RW)    Recommendations for Other Services OT consult     Precautions / Restrictions Precautions Precautions: Fall Restrictions Weight Bearing Restrictions: No      Mobility  Bed Mobility               General bed mobility comments: Pt up in chair and requests back to same  Transfers Overall transfer level: Needs assistance Equipment used: Rolling walker (2 wheeled) Transfers: Sit to/from Stand Sit to Stand: Min guard         General transfer comment: steady assist and cues for use of UEs to self assist  Ambulation/Gait Ambulation/Gait assistance: Min assist;Min guard Gait Distance (Feet): 60 Feet (twice with seated rest break ) Assistive device: Rolling walker (2 wheeled) Gait Pattern/deviations: Step-to pattern;Decreased step length - right;Decreased step length - left;Shuffle;Trunk flexed;Wide base of support Gait velocity: decr   General Gait Details: cues for posture and position from RW.  Assist to steady and manage RW but improved stability with increased distance ambulated  Stairs            Wheelchair Mobility    Modified Rankin (Stroke Patients Only)        Balance Overall balance assessment: Needs assistance Sitting-balance support: No upper extremity supported;Feet supported Sitting balance-Leahy Scale: Good     Standing balance support: Bilateral upper extremity supported Standing balance-Leahy Scale: Poor                               Pertinent Vitals/Pain Pain Assessment: 0-10 Pain Score: 4  Pain Location: L knee with ambulation Pain Descriptors / Indicators: Aching;Sore Pain Intervention(s): Limited activity within patient's tolerance;Monitored during session    [place] expects to be discharged to:: Private residence Living Arrangements: Spouse/significant other;Children Available Help at Discharge: Family;Available 24 hours/day Type of Home: House Home Access: Stairs to enter Entrance Stairs-Rails: Right Entrance Stairs-Number of Steps: 6 Home Layout: Multi-level Home Equipment: Cane - single point      Prior Function Level of Independence: Independent with assistive device(s)         Comments: pt uses cane      Hand Dominance        Extremity/Trunk Assessment   Upper Extremity Assessment Upper Extremity Assessment: Generalized weakness    Lower Extremity Assessment Lower Extremity Assessment: Generalized weakness    Cervical / Trunk Assessment Cervical / Trunk Assessment: Kyphotic  Communication   Communication: No difficulties  Cognition Arousal/Alertness: Awake/alert Behavior During Therapy: WFL for tasks assessed/performed Overall Cognitive Status: Within Functional Limits for tasks assessed  General Comments      Exercises     Assessment/Plan    PT Assessment Patient needs continued PT services  PT Problem List Decreased strength;Decreased activity tolerance;Decreased balance;Decreased mobility;Decreased knowledge of use of DME;Obesity;Pain       PT Treatment Interventions DME instruction;Gait  training;Stair training;Functional mobility training;Therapeutic activities;Therapeutic exercise;Balance training;Patient/family education    PT Goals (Current goals can be found in the Care Plan section)  Acute Rehab PT Goals Patient Stated Goal: Regain IND PT Goal Formulation: With patient Time For Goal Achievement: 01/18/20 Potential to Achieve Goals: Good    Frequency Min 3X/week   Barriers to discharge        Co-evaluation               AM-PAC PT "6 Clicks" Mobility  Outcome Measure Help needed turning from your back to your side while in a flat bed without using bedrails?: A Little Help needed moving from lying on your back to sitting on the side of a flat bed without using bedrails?: A Lot Help needed moving to and from a bed to a chair (including a wheelchair)?: A Little Help needed standing up from a chair using your arms (e.g., wheelchair or bedside chair)?: A Little Help needed to walk in hospital room?: A Little Help needed climbing 3-5 steps with a railing? : A Lot 6 Click Score: 16    End of Session Equipment Utilized During Treatment: Gait belt Activity Tolerance: Patient tolerated treatment well Patient left: in chair;with call bell/phone within reach Nurse Communication: Mobility status PT Visit Diagnosis: Unsteadiness on feet (R26.81);Muscle weakness (generalized) (M62.81);History of falling (Z91.81);Difficulty in walking, not elsewhere classified (R26.2);Pain Pain - Right/Left: Left Pain - part of body: Knee    Time: 1151-1220 PT Time Calculation (min) (ACUTE ONLY): 29 min   Charges:   PT Evaluation $PT Eval Low Complexity: 1 Low PT Treatments $Gait Training: 8-22 mins        Debe Coder PT Acute Rehabilitation Services Pager [phone] Office [phone]   Afsheen Antony 01/04/2020, 4:08 PM

## 2020-01-04 NOTE — Progress Notes (Signed)
PROGRESS NOTE    Carlos Alvarez  NGE:[id number] DOB: [birth year] DOA: 01/03/2020 PCP: System, Pcp Not In  Brief Narrative:  74 year old male known DM TY 2, HTN, HLD, scoliosis Also of severe osteoarthritis left knee Status post accidental fall 01/03/2020-hit forehead-no LOC-EMS called but patient declined transport Subsequently severe back pain-ancillary information = decreased UOP increased nocturia  Transported to emergency room white count 11 BUNs/creatinine 63/5.0 CT back = bilateral hydroureteral nephrosis + multiple calculi + large left renal calculus-Foley placed yielding approximately 5 L fluid MRI back performed = no acute fracture Has prior history of urolithiasis status post Rx 40 years prior With constellation of findings urologist Dr. Rexene Alberts consulted and Foley catheter placed with immediate return of 5 L of urine  Assessment & Plan:   Principal Problem:   ARF (acute renal failure) ([place]) Active Problems:   Urinary retention   Bilateral hydronephrosis   Left renal stone   Bladder stones   Gross hematuria   BPH with obstruction/lower urinary tract symptoms   Acute lower UTI   Essential hypertension   Fall   Leukocytosis   1. Acute urinary retention?  Bladder outlet obstruction prostatomegaly a. Leave Foley in place as per urologist-continue Flomax 0.4 finasteride 5 mg-nursing to educate regarding leg bag etc. b. Outpatient follow-up for?  Cystolitholopaxy/small bladder stone and left renal calculus operative intervention 2. AKI on admission cyst bilatera Hydroureteronephrosis + traumatic hematuria from Foley 3. Large left renal calculus + bladder stones a. AKI resolving well-cut back fluid rate to 75 cc/H b. Monitor a.m. trends 4. Low back pain with fall on 9/8 5. Underlying left severe knee arthritis a. Therapy to consult given fall and facial abrasion for safety as he uses a cane at baseline b. May need rehab at home 6. Normocytic anemia with dilutional  component a. Likely secondary to hemodilution b. No further work-up 7. Leukocytosis on admission?  UTI a. Urine culture 9/9 is negative b. Stop all antibiotics 8. DM TY 2 underlying CKD unclear stage a. We will try to obtain medical records from Dr. Ashby Dawes in the outpatient setting b. For now given kidney function being deranged would hold Metformin XR 500 9. HTN a. Hold losartan 100 at this time b. Monitor trends 10. HLD 11. Scoliosis 12. BMI 39 with moderate to severe obesity  DVT prophylaxis: SCD Code Status: Full Family Communication: None present Disposition:   Status is: Inpatient  Remains inpatient appropriate because:Hemodynamically unstable and Persistent severe electrolyte disturbances   Dispo: The patient is from: Home              Anticipated d/c is to: Home              Anticipated d/c date is: 2 days              Patient currently is not medically stable to d/c.       Consultants:   Urologist  Procedures: CT scan  Antimicrobials: None   Subjective: Well conversant no distress "I feel like a new man" had a diet this morning no chest pain no fever no chills   Objective: Vitals:   01/03/20 1703 01/03/20 2120 01/04/20 0139 01/04/20 0527  BP: 140/62 (!) 145/54 (!) 149/59 (!) 157/75  Pulse: 60 72 69 76  Resp: 18 16 16 16   Temp: 98.2 F (36.8 C) 98.6 F (37 C) 98.4 F (36.9 C) 98.6 F (37 C)  TempSrc: Oral Oral Oral Oral  SpO2: 98% 93% 94% 93%  Weight: 102.2 kg     Height:        Intake/Output Summary (Last 24 hours) at 01/04/2020 0716 Last data filed at 01/04/2020 0536 Gross per 24 hour  Intake 752.65 ml  Output 10950 ml  Net -10197.35 ml   Filed Weights   01/03/20 1037 01/03/20 1703  Weight: 104.3 kg 102.2 kg    Examination:  General exam: EOMI NCAT no focal deficit neck soft supple moderate dentition thick neck Mallampati 4 Respiratory system: Clinically clear no added sound rales rhonchi Cardiovascular system: Y4-I3 soft  holosystolic murmur LUSB seems to be sinus rhythm Gastrointestinal system: Obese nontender no rebound Foley in place yellow urine. Central nervous system: Intact no focal power deficits sensory grossly intact Extremities: No swelling Skin: Patient has a facial abrasion over his left eyebrow Psychiatry: Euthymic pleasant  Data Reviewed: I have personally reviewed following labs and imaging studies BUNs/creatinine 63/5.0-->51/2.8 Calcium 8.8 Anion gap 15 White count 11.8-->9.6   Radiology Studies: CT Head Wo Contrast  Result Date: 01/03/2020 CLINICAL DATA:  Head trauma EXAM: CT HEAD WITHOUT CONTRAST TECHNIQUE: Contiguous axial images were obtained from the base of the skull through the vertex without intravenous contrast. COMPARISON:  11/16/2003 FINDINGS: Brain: No evidence of acute infarction, hemorrhage, hydrocephalus, extra-axial collection or mass lesion/mass effect. Periventricular and deep white matter hypodensity. Vascular: No hyperdense vessel or unexpected calcification. Skull: Normal. Negative for fracture or focal lesion. Sinuses/Orbits: No acute finding. 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 IMPRESSION: No acute intracranial pathology. Small-vessel white matter disease. Electronically Signed   By: Eddie Candle M.D.   On: 01/03/2020 09:31   CT Lumbar Spine Wo Contrast  Result Date: 01/03/2020 CLINICAL DATA:  Low back pain after trauma. EXAM: CT LUMBAR SPINE WITHOUT CONTRAST TECHNIQUE: Multidetector CT imaging of the lumbar spine was performed without intravenous contrast administration. Multiplanar CT image reconstructions were also generated. COMPARISON:  None. FINDINGS: Segmentation: Normal appearing lumbosacral junction but when numbering in this fashion there are ribs at L1. Without thoracic numbering, this is the employed numbering scheme. Alignment: Marked levoscoliosis.  Grade 1 anterolisthesis at L4-5. Vertebrae: No  acute fracture. Paraspinal and other soft tissues: Marked distension of the bladder bilateral hydroureteronephrosis. Numerous calculi layering in the bladder. There is also left nephrolithiasis with stone measuring 17 mm. Disc levels: Diffuse facet osteoarthritis with bulky right-sided spurring at the thoracolumbar junction and bilateral spurring at L3-4 and below. There are bridging intervertebral osteophytes at L4-5 and L5-S1. Right-sided foraminal impingement at L1-2 to L3-4 due to facet spurring and disc narrowing. High-grade spinal stenosis, especially towards the right, seen at L3-4. IMPRESSION: 1. No acute fracture. 2. Scoliosis with advanced degenerative disease. Spinal stenosis is high-grade at L3-4. 3. Over distended bladder with bilateral hydroureteronephrosis. There are numerous calculi layering within the bladder and a large left renal calculus. Electronically Signed   By: Monte Fantasia M.D.   On: 01/03/2020 09:38   MR HIP LEFT WO CONTRAST  Result Date: 01/03/2020 CLINICAL DATA:  Left hip pain since a fall 2 days ago. Initial encounter. EXAM: MR OF THE LEFT HIP WITHOUT CONTRAST TECHNIQUE: Multiplanar, multisequence MR imaging was performed. No intravenous contrast was administered. COMPARISON:  Plain films of the left hip today. FINDINGS: Bones: There is no fracture or worrisome lesion. Small sclerotic focus in the superior margin of the left femoral neck seen on plain films has benign features and may be an enchondroma. No worrisome marrow lesion is  identified. There is mild degenerative disease about the hips. Moderate SI joint osteoarthritis is also seen. Convex left lumbar curvature may be positional. Articular cartilage and labrum Articular cartilage:  Mildly degenerated. Labrum:  Appears intact. Joint or bursal effusion Joint effusion:  None. Bursae: Negative. Muscles and tendons Muscles and tendons:  Intact. Other findings Miscellaneous: The prostate is visualized in the coronal plane only  and measures approximately 8 cm craniocaudal by 8 cm transverse. Foley catheter is in place in the urinary bladder. There is air in the bladder likely from catheterization. Walls of the bladder are thickened. IMPRESSION: Negative for fracture. No acute abnormality. Small sclerotic focus in the superior aspect of the left femoral neck on plain films has benign features and may be a enchondroma. No follow-up imaging is recommended. Massive prostatomegaly. Although the bladder is incompletely distended, its walls are markedly thickened compatible with bladder outlet obstruction and/or cystitis. Convex left lumbar curvature is partially imaged and may be positional. Electronically Signed   By: Inge Rise M.D.   On: 01/03/2020 12:40   DG Knee Complete 4 Views Left  Result Date: 01/03/2020 CLINICAL DATA:  Fall 2 days ago with left knee pain, initial encounter EXAM: LEFT KNEE - COMPLETE 4+ VIEW COMPARISON:  None. FINDINGS: Tricompartmental degenerative changes are noted. These are most noted in the medial joint space. No joint effusion is seen. No acute fracture or dislocation is seen. IMPRESSION: Degenerative change without acute abnormality. Electronically Signed   By: Inez Catalina M.D.   On: 01/03/2020 09:15   DG Hip Unilat W or Wo Pelvis 2-3 Views Left  Addendum Date: 01/03/2020   ADDENDUM REPORT: 01/03/2020 09:44 ADDENDUM: As additional information on the current study there is sclerosis along the superior margin of the LEFT femoral neck, interrupting trabecular pattern,, this is of uncertain significance. This may represent a stress fracture or nondisplaced fracture though could also represent a synovial herniation pit. Based on the limitations of the current exam if there is high clinical suspicion for fracture CT or MRI assessment may be helpful. These results were called by telephone at the time of interpretation on 01/03/2020 at 9:43 am to provider Dr Percell Miller, who verbally acknowledged these results.  Electronically Signed   By: Zetta Bills M.D.   On: 01/03/2020 09:44   Result Date: 01/03/2020 CLINICAL DATA:  Fall 2 days ago evaluate for fracture EXAM: DG HIP (WITH OR WITHOUT PELVIS) 2-3V LEFT COMPARISON:  None FINDINGS: Degenerative changes about the hips. Study limited by patient body habitus without sign of fracture or dislocation. Marked degenerative changes in the lumbar spine are incidentally noted. No sign of pelvic fracture. IMPRESSION: Degenerative changes without signs of fracture or dislocation. Electronically Signed: By: Zetta Bills M.D. On: 01/03/2020 09:14     Scheduled Meds: . atenolol  50 mg Oral Daily  . finasteride  5 mg Oral Daily  . heparin  5,000 Units Subcutaneous Q8H  . sodium chloride flush  3 mL Intravenous Q12H  . tamsulosin  0.4 mg Oral QPC supper   Continuous Infusions: . sodium chloride 100 mL/hr at 01/03/20 1800  . cefTRIAXone (ROCEPHIN)  IV       LOS: 1 day    Time spent: [place], MD [facility] To contact the attending provider between 7A-7P or the covering provider during after hours 7P-7A, please log into the web site [email or web address] and access using universal [place] password for that web site. If you do not have the password, please  call the hospital operator.  01/04/2020, 7:16 AM

## 2020-01-04 NOTE — Progress Notes (Addendum)
Subjective: Denies significant abdominal or flank pain.  Denies nausea or emesis.  Tolerating diet.  Afebrile.  Urine draining clear yellow urine without leaks or bladder spasms.  Adequate urine output  Objective: Vital signs in last 24 hours: Temp:  [98.2 F (36.8 C)-98.6 F (37 C)] 98.2 F (36.8 C) (09/10 1410) Pulse Rate:  [60-76] 70 (09/10 1410) Resp:  [14-19] 14 (09/10 1410) BP: (140-174)/(54-75) 174/61 (09/10 1410) SpO2:  [93 %-98 %] 96 % (09/10 1410) Weight:  [102.2 kg] 102.2 kg (09/09 1703)  Intake/Output from previous day: 09/09 0701 - 09/10 0700 In: 752.7 [I.V.:652.7; IV Piggyback:100] Out: 10950 [Urine:10950] Intake/Output this shift: Total I/O In: 480 [P.O.:480] Out: 1600 [Urine:1600]  Physical Exam:  General: Alert and oriented CV: RRR Lungs: Clear Abdomen: Soft, ND, ATTP; inc c/d/I Ext: NT, No erythema  Lab Results: Recent Labs    01/03/20 1036 01/04/20 0541  HGB 11.7* 10.9*  HCT 35.6* 32.7*   BMET Recent Labs    01/03/20 1036 01/04/20 0541  NA 135 145  K 4.4 4.0  CL 98 104  CO2 23 26  GLUCOSE 130* 124*  BUN 63* 51*  CREATININE 5.07* 2.85*  CALCIUM 9.0 8.8*     Studies/Results: CT Head Wo Contrast  Result Date: 01/03/2020 CLINICAL DATA:  Head trauma EXAM: CT HEAD WITHOUT CONTRAST TECHNIQUE: Contiguous axial images were obtained from the base of the skull through the vertex without intravenous contrast. COMPARISON:  11/16/2003 FINDINGS: Brain: No evidence of acute infarction, hemorrhage, hydrocephalus, extra-axial collection or mass lesion/mass effect. Periventricular and deep white matter hypodensity. Vascular: No hyperdense vessel or unexpected calcification. Skull: Normal. Negative for fracture or focal lesion. Sinuses/Orbits: No acute finding. 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 IMPRESSION: No acute intracranial pathology. Small-vessel white matter disease. Electronically  Signed   By: Eddie Candle M.D.   On: 01/03/2020 09:31   CT Lumbar Spine Wo Contrast  Result Date: 01/03/2020 CLINICAL DATA:  Low back pain after trauma. EXAM: CT LUMBAR SPINE WITHOUT CONTRAST TECHNIQUE: Multidetector CT imaging of the lumbar spine was performed without intravenous contrast administration. Multiplanar CT image reconstructions were also generated. COMPARISON:  None. FINDINGS: Segmentation: Normal appearing lumbosacral junction but when numbering in this fashion there are ribs at L1. Without thoracic numbering, this is the employed numbering scheme. Alignment: Marked levoscoliosis.  Grade 1 anterolisthesis at L4-5. Vertebrae: No acute fracture. Paraspinal and other soft tissues: Marked distension of the bladder bilateral hydroureteronephrosis. Numerous calculi layering in the bladder. There is also left nephrolithiasis with stone measuring 17 mm. Disc levels: Diffuse facet osteoarthritis with bulky right-sided spurring at the thoracolumbar junction and bilateral spurring at L3-4 and below. There are bridging intervertebral osteophytes at L4-5 and L5-S1. Right-sided foraminal impingement at L1-2 to L3-4 due to facet spurring and disc narrowing. High-grade spinal stenosis, especially towards the right, seen at L3-4. IMPRESSION: 1. No acute fracture. 2. Scoliosis with advanced degenerative disease. Spinal stenosis is high-grade at L3-4. 3. Over distended bladder with bilateral hydroureteronephrosis. There are numerous calculi layering within the bladder and a large left renal calculus. Electronically Signed   By: Monte Fantasia M.D.   On: 01/03/2020 09:38   MR HIP LEFT WO CONTRAST  Result Date: 01/03/2020 CLINICAL DATA:  Left hip pain since a fall 2 days ago. Initial encounter. EXAM: MR OF THE LEFT HIP WITHOUT CONTRAST TECHNIQUE: Multiplanar, multisequence MR imaging was performed. No intravenous contrast was administered. COMPARISON:  Plain films of  the left hip today. FINDINGS: Bones: There is no  fracture or worrisome lesion. Small sclerotic focus in the superior margin of the left femoral neck seen on plain films has benign features and may be an enchondroma. No worrisome marrow lesion is identified. There is mild degenerative disease about the hips. Moderate SI joint osteoarthritis is also seen. Convex left lumbar curvature may be positional. Articular cartilage and labrum Articular cartilage:  Mildly degenerated. Labrum:  Appears intact. Joint or bursal effusion Joint effusion:  None. Bursae: Negative. Muscles and tendons Muscles and tendons:  Intact. Other findings Miscellaneous: The prostate is visualized in the coronal plane only and measures approximately 8 cm craniocaudal by 8 cm transverse. Foley catheter is in place in the urinary bladder. There is air in the bladder likely from catheterization. Walls of the bladder are thickened. IMPRESSION: Negative for fracture. No acute abnormality. Small sclerotic focus in the superior aspect of the left femoral neck on plain films has benign features and may be a enchondroma. No follow-up imaging is recommended. Massive prostatomegaly. Although the bladder is incompletely distended, its walls are markedly thickened compatible with bladder outlet obstruction and/or cystitis. Convex left lumbar curvature is partially imaged and may be positional. Electronically Signed   By: Inge Rise M.D.   On: 01/03/2020 12:40   DG Knee Complete 4 Views Left  Result Date: 01/03/2020 CLINICAL DATA:  Fall 2 days ago with left knee pain, initial encounter EXAM: LEFT KNEE - COMPLETE 4+ VIEW COMPARISON:  None. FINDINGS: Tricompartmental degenerative changes are noted. These are most noted in the medial joint space. No joint effusion is seen. No acute fracture or dislocation is seen. IMPRESSION: Degenerative change without acute abnormality. Electronically Signed   By: Inez Catalina M.D.   On: 01/03/2020 09:15   DG Hip Unilat W or Wo Pelvis 2-3 Views Left  Addendum  Date: 01/03/2020   ADDENDUM REPORT: 01/03/2020 09:44 ADDENDUM: As additional information on the current study there is sclerosis along the superior margin of the LEFT femoral neck, interrupting trabecular pattern,, this is of uncertain significance. This may represent a stress fracture or nondisplaced fracture though could also represent a synovial herniation pit. Based on the limitations of the current exam if there is high clinical suspicion for fracture CT or MRI assessment may be helpful. These results were called by telephone at the time of interpretation on 01/03/2020 at 9:43 am to provider Dr Percell Miller, who verbally acknowledged these results. Electronically Signed   By: Zetta Bills M.D.   On: 01/03/2020 09:44   Result Date: 01/03/2020 CLINICAL DATA:  Fall 2 days ago evaluate for fracture EXAM: DG HIP (WITH OR WITHOUT PELVIS) 2-3V LEFT COMPARISON:  None FINDINGS: Degenerative changes about the hips. Study limited by patient body habitus without sign of fracture or dislocation. Marked degenerative changes in the lumbar spine are incidentally noted. No sign of pelvic fracture. IMPRESSION: Degenerative changes without signs of fracture or dislocation. Electronically Signed: By: Zetta Bills M.D. On: 01/03/2020 09:14    Assessment/Plan: Impression: 1. Urinary retention: CT lumbar and MRI of the pelvis on 01/03/2020 demonstrated an over distended bladder with bilateral hydroureteronephrosis.  Foley catheter placed on 01/03/2020 with immediate return of 5 L of clear yellow urine. 2. Bilateral hydroureteronephrosis: Likely due to bladder outlet obstruction from enlarged prostate.  3. Bladder outlet obstruction: Prostatomegaly seen on imaging 01/03/2020 4. Acute renal failure: Likely postobstructive given 5 L of urine output after Foley catheter placed on 01/03/2020.  Creatinine 5.07 on 01/03/2020  from an unknown baseline. Downtrended to 2.85 on 9/10.  5. Gross hematuria: Noticed after 5 L of clear yellow urine  drained.  Likely related to indwelling Foley catheter placement and postobstructive diuresis. 6. Large left renal calculus: Identified on CT lumbar spine 01/03/2020 7. Bladder stones: Identified on CT lumbar spine 01/03/2020  Plan: -Leave Foley catheter in place for at least 7 days given significant over distention.  Allow for bladder rest.  Patient will follow up in the office for Foley catheter management including possible void trial. -Trend creatinine until it nadirs. Trending down to 2.85 today. -Obtain non-con CT A/P on Sunday AM prior to discharge to evaluate resolution of hydronephrosis. Ordered for Sunday AM. -Continue flomax and finasteride -Gross hematuria likely related to indwelling Foley catheter.  Irrigate catheter as needed for clots or decreased drainage. -He will likely require surgical intervention for his enlarged prostate contributing to bladder outlet obstruction.  We will plan for concomitant cystolitholapaxy/drainage of small bladder sotones.  He will also likely need intervention for his left renal calculus.  This will be done outpatient after he recovers from his inpatient admission. -Messaged office schedulers to arrange for followup   LOS: 1 day   Janith Lima 01/04/2020, 3:40 PM [facility]  Pager: [phone]

## 2020-01-04 NOTE — Plan of Care (Signed)

## 2020-01-05 LAB — COMPREHENSIVE METABOLIC PANEL
ALT: 25 U/L (ref 0–44)
AST: 19 U/L (ref 15–41)
Albumin: 3.1 g/dL — ABNORMAL LOW (ref 3.5–5.0)
Alkaline Phosphatase: 46 U/L (ref 38–126)
Anion gap: 14 (ref 5–15)
BUN: 30 mg/dL — ABNORMAL HIGH (ref 8–23)
CO2: 26 mmol/L (ref 22–32)
Calcium: 8.5 mg/dL — ABNORMAL LOW (ref 8.9–10.3)
Chloride: 101 mmol/L (ref 98–111)
Creatinine, Ser: 1.66 mg/dL — ABNORMAL HIGH (ref 0.61–1.24)
GFR calc Af Amer: 46 mL/min — ABNORMAL LOW (ref >60)
GFR calc non Af Amer: 39 mL/min — ABNORMAL LOW (ref >60)
Glucose, Bld: 185 mg/dL — ABNORMAL HIGH (ref 70–99)
Potassium: 3.3 mmol/L — ABNORMAL LOW (ref 3.5–5.1)
Sodium: 141 mmol/L (ref 135–145)
Total Bilirubin: 0.8 mg/dL (ref 0.3–1.2)
Total Protein: 6.6 g/dL (ref 6.5–8.1)

## 2020-01-05 MED ORDER — NYSTATIN 100000 UNIT/GM EX POWD
Freq: Two times a day (BID) | CUTANEOUS | Status: DC
  Filled 2020-01-05: qty 15

## 2020-01-05 NOTE — Progress Notes (Signed)
Physical Therapy Treatment Patient Details Name: Carlos Alvarez MRN: [id number] DOB: [birth year] Today's Date: 01/05/2020    History of Present Illness Pt admitted s/p fall and dx with acute renal failure, renal retention, UTI.  Pt with hx of DM, scoliosis, severe L knee OA, and obesity    PT Comments    Pt in good spirits and with reported decreased pain allowing increased activity tolerance ambulating increased distance in hall.   Follow Up Recommendations  Outpatient PT     Equipment Recommendations  Rolling walker with 5" wheels    Recommendations for Other Services OT consult     Precautions / Restrictions Precautions Precautions: Fall Restrictions Weight Bearing Restrictions: No    Mobility  Bed Mobility               General bed mobility comments: Pt up in chair and requests back to same  Transfers Overall transfer level: Needs assistance Equipment used: Rolling walker (2 wheeled) Transfers: Sit to/from Stand Sit to Stand: Min guard;Supervision         General transfer comment: steady assist and cues for use of UEs to self assist  Ambulation/Gait Ambulation/Gait assistance: Min assist;Min guard Gait Distance (Feet): 120 Feet Assistive device: Rolling walker (2 wheeled) Gait Pattern/deviations: Step-to pattern;Decreased step length - right;Decreased step length - left;Shuffle;Trunk flexed;Wide base of support Gait velocity: decr   General Gait Details: cues for posture and position from RW.  Assist to steady and manage RW but improved stability with increased distance ambulated   Stairs             Wheelchair Mobility    Modified Rankin (Stroke Patients Only)       Balance Overall balance assessment: Needs assistance Sitting-balance support: No upper extremity supported;Feet supported Sitting balance-Leahy Scale: Good     Standing balance support: Bilateral upper extremity supported Standing balance-Leahy Scale: Fair                               Cognition Arousal/Alertness: Awake/alert Behavior During Therapy: WFL for tasks assessed/performed Overall Cognitive Status: Within Functional Limits for tasks assessed                                        Exercises      General Comments        Pertinent Vitals/Pain Pain Assessment: 0-10 Pain Score: 2  Pain Location: L knee with ambulation Pain Descriptors / Indicators: Aching;Sore Pain Intervention(s): Limited activity within patient's tolerance;Monitored during session;Premedicated before session    Home Living                      Prior Function            PT Goals (current goals can now be found in the care plan section) Acute Rehab PT Goals Patient Stated Goal: Regain IND PT Goal Formulation: With patient Time For Goal Achievement: 01/18/20 Potential to Achieve Goals: Good Progress towards PT goals: Progressing toward goals    Frequency    Min 3X/week      PT Plan Current plan remains appropriate    Co-evaluation              AM-PAC PT "6 Clicks" Mobility   Outcome Measure  Help needed turning from your back to your side while in a flat  bed without using bedrails?: A Little Help needed moving from lying on your back to sitting on the side of a flat bed without using bedrails?: A Lot Help needed moving to and from a bed to a chair (including a wheelchair)?: A Little Help needed standing up from a chair using your arms (e.g., wheelchair or bedside chair)?: A Little Help needed to walk in hospital room?: A Little Help needed climbing 3-5 steps with a railing? : A Lot 6 Click Score: 16    End of Session Equipment Utilized During Treatment: Gait belt Activity Tolerance: Patient tolerated treatment well Patient left: in chair;with call bell/phone within reach Nurse Communication: Mobility status PT Visit Diagnosis: Unsteadiness on feet (R26.81);Muscle weakness (generalized) (M62.81);History of  falling (Z91.81);Difficulty in walking, not elsewhere classified (R26.2);Pain Pain - Right/Left: Left Pain - part of body: Knee     Time: 8616-8372 PT Time Calculation (min) (ACUTE ONLY): 14 min  Charges:  $Gait Training: 8-22 mins                     [place] Pager [phone] Office [phone]    Tor Tsuda 01/05/2020, 10:31 AM

## 2020-01-05 NOTE — Progress Notes (Signed)
PROGRESS NOTE    Carlos Alvarez  SWN:[id number] DOB: 02-02-1946 DOA: 01/03/2020 PCP: System, Pcp Not In  Brief Narrative:  74 year old male known DM TY 2, HTN, HLD, scoliosis Also of severe osteoarthritis left knee  Status post accidental fall 01/03/2020-hit forehead-no LOC-EMS called but patient declined transport Subsequently severe back pain-ancillary information = decreased UOP increased nocturia  Transported to emergency room white count 11 BUNs/creatinine 63/5.0 CT back = bilateral hydroureteral nephrosis + multiple calculi + large left renal calculus-Foley placed yielding approximately 5 L fluid MRI back performed = no acute fracture Has prior history of urolithiasis status post Rx 40 years prior With constellation of findings urologist Dr. Rexene Alberts consulted and Foley catheter placed with immediate return of 5 L of urine  Assessment & Plan:   Principal Problem:   ARF (acute renal failure) ([place]) Active Problems:   Urinary retention   Bilateral hydronephrosis   Left renal stone   Bladder stones   Gross hematuria   BPH with obstruction/lower urinary tract symptoms   Acute lower UTI   Essential hypertension   Fall   Leukocytosis   1. Acute urinary retention?  Bladder outlet obstruction prostatomegaly a. Leave Foley in place as per urologist-continue Flomax 0.4 finasteride 5 mg-nursing to educate regarding leg bag etc. b. Outpatient follow-up for?  Cystolitholopaxy/small bladder stone and left renal calculus operative intervention 2. AKI on admission (baseline creatinine is 1.2 from Dr. Mathis Fare records) cyst bilatera Hydroureteronephrosis + traumatic hematuria from Foley 3. Large left renal calculus + bladder stones a. AKI resolving- fluid rate to 50 cc/H-saline lock in a.m. b. CT scan abdomen pelvis noncontrast ordered for 9/12 to ensure hydronephrosis has resolved and if so will be stable for discharge 4. Mild hypokalemia 5. Recheck labs a.m.-no replacement today  but can give on discharge if needed 6. Low back pain with fall on 9/8 7. Underlying left severe knee arthritis a. Therapy to consult given fall and facial abrasion for safety as he uses a cane at baseline b. Therapy recommends outpatient PT with rolling walker c. Aspercreme for left knee 8. Normocytic anemia with dilutional component a. Likely secondary to hemodilution b. No further work-up 9. Leukocytosis on admission?  UTI a. Urine culture 9/9 is negative b. Stop all antibiotics c. DM TY 2 A1c 5.8 for now given kidney function being deranged would hold Metformin XR 500 d. CBGs range 1 24-1 80 therefore discontinue sliding scale e. He is eating fairly well 100% of meals 10. HTN a. Hold losartan 100 at this time would not resume until seen at PCP office b. Placed on amlodipine 5 this admission 11. HLD 12. Scoliosis 13. BMI 39 with moderate to severe obesity  DVT prophylaxis: SCD Code Status: Full Family Communication: None present Disposition:   Status is: Inpatient  Remains inpatient appropriate because:Hemodynamically unstable and Persistent severe electrolyte disturbances   Dispo: The patient is from: Home              Anticipated d/c is to: Home              Anticipated d/c date is: 2 days              Patient currently is not medically stable to d/c.  Consultants:   Urologist  Procedures: CT scan  Antimicrobials: None   Subjective: Doing fair No chest pain Passing good urine Knee pain is improved with Aspercreme and he ambulated with PT   Objective: Vitals:   01/04/20 1410 01/05/20 0026  01/05/20 0613 01/05/20 1352  BP: (!) 174/61 (!) 147/58 (!) 158/66 (!) 141/67  Pulse: 70 71 70 68  Resp: 14 17 18 18   Temp: 98.2 F (36.8 C) 98.2 F (36.8 C) 98.3 F (36.8 C) 98.7 F (37.1 C)  TempSrc: Oral Oral Oral Oral  SpO2: 96% 95% 93% 96%  Weight:      Height:        Intake/Output Summary (Last 24 hours) at 01/05/2020 1419 Last data filed at 01/05/2020  0900 Gross per 24 hour  Intake 2156.73 ml  Output 3500 ml  Net -1343.27 ml   Filed Weights   01/03/20 1037 01/03/20 1703  Weight: 104.3 kg 102.2 kg    Examination:  Pleasant coherent thick neck bruise to forehead improved Chest clear Abdomen soft nontender no rebound no guarding S1-S2 no murmur Mild lower extremity edema left side greater than sign right Neurologically intact ROM intact slight bruising to right shin and knee in addition  Data Reviewed: I have personally reviewed following labs and imaging studies BUNs/creatinine 63/5.0-->51/2.8-->1.6 creatinine Calcium 8.5 corrected is normal range Potassium 3.3 White count 11.8-->9.6   Radiology Studies: No results found.   Scheduled Meds: . amLODipine  5 mg Oral Daily  . atenolol  50 mg Oral Daily  . Chlorhexidine Gluconate Cloth  6 each Topical Daily  . finasteride  5 mg Oral Daily  . heparin  5,000 Units Subcutaneous Q8H  . sodium chloride flush  3 mL Intravenous Q12H  . tamsulosin  0.4 mg Oral QPC supper   Continuous Infusions: . sodium chloride 75 mL/hr at 01/05/20 0818     LOS: 2 days    Time spent: [place], MD [facility] To contact the attending provider between 7A-7P or the covering provider during after hours 7P-7A, please log into the web site [email or web address] and access using universal [place] password for that web site. If you do not have the password, please call the hospital operator.  01/05/2020, 2:19 PM

## 2020-01-06 ENCOUNTER — Inpatient Hospital Stay (HOSPITAL_COMMUNITY): Payer: Medicare Other

## 2020-01-06 LAB — CBC WITH DIFFERENTIAL/PLATELET
Abs Immature Granulocytes: 0.02 10*3/uL (ref 0.00–0.07)
Basophils Absolute: 0 10*3/uL (ref 0.0–0.1)
Basophils Relative: 1 %
Eosinophils Absolute: 0.6 10*3/uL — ABNORMAL HIGH (ref 0.0–0.5)
Eosinophils Relative: 7 %
HCT: 32.9 % — ABNORMAL LOW (ref 39.0–52.0)
Hemoglobin: 10.8 g/dL — ABNORMAL LOW (ref 13.0–17.0)
Immature Granulocytes: 0 %
Lymphocytes Relative: 22 %
Lymphs Abs: 1.7 10*3/uL (ref 0.7–4.0)
MCH: 31 pg (ref 26.0–34.0)
MCHC: 32.8 g/dL (ref 30.0–36.0)
MCV: 94.5 fL (ref 80.0–100.0)
Monocytes Absolute: 1 10*3/uL (ref 0.1–1.0)
Monocytes Relative: 12 %
Neutro Abs: 4.7 10*3/uL (ref 1.7–7.7)
Neutrophils Relative %: 58 %
Platelets: 276 10*3/uL (ref 150–400)
RBC: 3.48 MIL/uL — ABNORMAL LOW (ref 4.22–5.81)
RDW: 12.9 % (ref 11.5–15.5)
WBC: 8 10*3/uL (ref 4.0–10.5)
nRBC: 0 % (ref 0.0–0.2)

## 2020-01-06 LAB — COMPREHENSIVE METABOLIC PANEL
ALT: 25 U/L (ref 0–44)
AST: 20 U/L (ref 15–41)
Albumin: 3 g/dL — ABNORMAL LOW (ref 3.5–5.0)
Alkaline Phosphatase: 44 U/L (ref 38–126)
Anion gap: 14 (ref 5–15)
BUN: 20 mg/dL (ref 8–23)
CO2: 27 mmol/L (ref 22–32)
Calcium: 8.4 mg/dL — ABNORMAL LOW (ref 8.9–10.3)
Chloride: 99 mmol/L (ref 98–111)
Creatinine, Ser: 1.32 mg/dL — ABNORMAL HIGH (ref 0.61–1.24)
GFR calc Af Amer: >60 mL/min (ref >60)
GFR calc non Af Amer: 52 mL/min — ABNORMAL LOW (ref >60)
Glucose, Bld: 121 mg/dL — ABNORMAL HIGH (ref 70–99)
Potassium: 3.1 mmol/L — ABNORMAL LOW (ref 3.5–5.1)
Sodium: 140 mmol/L (ref 135–145)
Total Bilirubin: 0.6 mg/dL (ref 0.3–1.2)
Total Protein: 6.5 g/dL (ref 6.5–8.1)

## 2020-01-06 MED ORDER — AMLODIPINE BESYLATE 5 MG PO TABS: 5.0000 mg | ORAL_TABLET | Freq: Every day | ORAL | 3 refills | Status: AC

## 2020-01-06 MED ORDER — FINASTERIDE 5 MG PO TABS: 5.0000 mg | ORAL_TABLET | Freq: Every day | ORAL | 3 refills | Status: AC

## 2020-01-06 MED ORDER — TAMSULOSIN HCL 0.4 MG PO CAPS: 0.4000 mg | ORAL_CAPSULE | Freq: Every day | ORAL | 3 refills | Status: AC

## 2020-01-06 MED ORDER — MUSCLE RUB 10-15 % EX CREA: 1.0000 "application " | TOPICAL_CREAM | CUTANEOUS | 0 refills | Status: AC | PRN

## 2020-01-06 MED ORDER — POTASSIUM CHLORIDE CRYS ER 20 MEQ PO TBCR: 40.0000 meq | EXTENDED_RELEASE_TABLET | Freq: Two times a day (BID) | ORAL | 0 refills | Status: AC

## 2020-01-06 NOTE — Treatment Plan (Signed)
Urology Treatment Plan Note  Reviewed repeat CT A/P today, which demonstrated resolution of bilateral hydronephrosis after bladder decompression with foley catheter.   -Leave Foley catheter in place for at least 7 days given significant over distention. Allow for bladder rest. Patient will follow up in the office for Foley catheter management including possible void trial. -Trend creatinine until it nadirs. Cr continuing to downtrend, 1.32 today from 5.07 on presentation -Continue flomax and finasteride -We will arrange appropriate urology follow up. Hewill likely require surgical intervention for his enlarged prostate contributing to bladder outlet obstruction. We will plan for concomitant cystolitholapaxy/drainage of small bladder stones.Hewill also likely need intervention for his left renal calculus. This will be done outpatient after he recovers from his inpatient admission.  Celene Squibb, MD Urology Resident, PGY4

## 2020-01-06 NOTE — Discharge Instructions (Signed)
Acute Kidney Injury, Adult  Acute kidney injury is a sudden worsening of kidney function. The kidneys are organs that have several jobs. They filter the blood to remove waste products and extra fluid. They also maintain a healthy balance of minerals and hormones in the body, which helps control blood pressure and keep bones strong. With this condition, your kidneys do not do their jobs as well as they should. This condition ranges from mild to severe. Over time it may develop into long-lasting (chronic) kidney disease. Early detection and treatment may prevent acute kidney injury from developing into a chronic condition. What are the causes? Common causes of this condition include:  A problem with blood flow to the kidneys. This may be caused by: ? Low blood pressure (hypotension) or shock. ? Blood loss. ? Heart and blood vessel (cardiovascular) disease. ? Severe burns. ? Liver disease.  Direct damage to the kidneys. This may be caused by: ? Certain medicines. ? A kidney infection. ? Poisoning. ? Being around or in contact with toxic substances. ? A surgical wound. ? A hard, direct hit to the kidney area.  A sudden blockage of urine flow. This may be caused by: ? Cancer. ? Kidney stones. ? An enlarged prostate in males. What are the signs or symptoms? Symptoms of this condition may not be obvious until the condition becomes severe. Symptoms of this condition can include:  Tiredness (lethargy), or difficulty staying awake.  Nausea or vomiting.  Swelling (edema) of the face, legs, ankles, or feet.  Problems with urination, such as: ? Abdominal pain, or pain along the side of your stomach (flank). ? Decreased urine production. ? Decrease in the force of urine flow.  Muscle twitches and cramps, especially in the legs.  Confusion or trouble concentrating.  Loss of appetite.  Fever. How is this diagnosed? This condition may be diagnosed with tests, including:  Blood  tests.  Urine tests.  Imaging tests.  A test in which a sample of tissue is removed from the kidneys to be examined under a microscope (kidney biopsy). How is this treated? Treatment for this condition depends on the cause and how severe the condition is. In mild cases, treatment may not be needed. The kidneys may heal on their own. In more severe cases, treatment will involve:  Treating the cause of the kidney injury. This may involve changing any medicines you are taking or adjusting your dosage.  Fluids. You may need specialized IV fluids to balance your body's needs.  Having a catheter placed to drain urine and prevent blockages.  Preventing problems from occurring. This may mean avoiding certain medicines or procedures that can cause further injury to the kidneys. In some cases treatment may also require:  A procedure to remove toxic wastes from the body (dialysis or continuous renal replacement therapy - CRRT).  Surgery. This may be done to repair a torn kidney, or to remove the blockage from the urinary system. Follow these instructions at home: Medicines  Take over-the-counter and prescription medicines only as told by your health care provider.  Do not take any new medicines without your health care provider's approval. Many medicines can worsen your kidney damage.  Do not take any vitamin and mineral supplements without your health care provider's approval. Many nutritional supplements can worsen your kidney damage. Lifestyle  If your health care provider prescribed changes to your diet, follow them. You may need to decrease the amount of protein you eat.  Achieve and maintain a healthy  weight. If you need help with this, ask your health care provider.  Start or continue an exercise plan. Try to exercise at least 30 minutes a day, 5 days a week.  Do not use any tobacco products, such as cigarettes, chewing tobacco, and e-cigarettes. If you need help quitting, ask your  health care provider. General instructions  Keep track of your blood pressure. Report changes in your blood pressure as told by your health care provider.  Stay up to date with immunizations. Ask your health care provider which immunizations you need.  Keep all follow-up visits as told by your health care provider. This is important. Where to find more information  American Association of Kidney Patients: BombTimer.gl  National Kidney Foundation: www.kidney.[place]: https://mathis.com/  [facility]: ? www.lifeoptions.org ? www.kidneyschool.org Contact a health care provider if:  Your symptoms get worse.  You develop new symptoms. Get help right away if:  You develop symptoms of worsening kidney disease, which include: ? Headaches. ? Abnormally dark or light skin. ? Easy bruising. ? Frequent hiccups. ? Chest pain. ? Shortness of breath. ? End of menstruation in women. ? Seizures. ? Confusion or altered mental status. ? Abdominal or back pain. ? Itchiness.  You have a fever.  Your body is producing less urine.  You have pain or bleeding when you urinate. Summary  Acute kidney injury is a sudden worsening of kidney function.  Acute kidney injury can be caused by problems with blood flow to the kidneys, direct damage to the kidneys, and sudden blockage of urine flow.  Symptoms of this condition may not be obvious until it becomes severe. Symptoms may include edema, lethargy, confusion, nausea or vomiting, and problems passing urine.  This condition can usually be diagnosed with blood tests, urine tests, and imaging tests. Sometimes a kidney biopsy is done to diagnose this condition.  Treatment for this condition often involves treating the underlying cause. It is treated with fluids, medicines, dialysis, diet changes, or surgery. This information is not intended to replace advice given to you by your health care provider. Make  sure you discuss any questions you have with your health care provider. Document Revised: 03/25/2017 Document Reviewed: 04/02/2016 Elsevier Patient Education  [place].   Contrast-Induced Nephropathy Contrast-induced nephropathy (CIN) is a rare type of kidney damage caused by the dye that may be used in certain imaging tests. This dye is called a contrast medium. For these tests, the dye may be injected into a person's body to make tissue or blood vessels show up more clearly. A contrast medium may be used in imaging tests such as:  CT scans.  MRIs.  Angiograms. With CIN, the contrast medium may damage kidney cells directly. It may also cause a reaction in the kidneys that decreases blood supply and oxygen to the kidneys. Lack of blood and oxygen causes temporary kidney damage that usually gets better in about 2 weeks. Very rarely, severe damage may require the kidneys to be filtered by a machine (dialysis). What are the causes? The exact cause of CIN is not known. In some cases, there may be more than one cause. What increases the risk? The following factors may make you more likely to develop this condition:  Having kidney disease before you are exposed to contrast media. This is the main risk factor.  Having other conditions, such as: ? Dehydration. ? Diabetes. ? High blood pressure. ? Low blood pressure. ? Heart disease. ? Anemia. ?  A type of blood cancer that affects the kidneys (multiple myeloma).  Older age.  Needing an emergency exam that requires the use of contrast media.  Receiving a high dose of contrast media or receiving contrast media within 72 hours of the previous time you got it.  Having contrast media injected into a blood vessel that carries blood away from the heart (artery).  Being on a medicine that affects kidney function. These include NSAIDs, some antibiotics, and some cancer drugs. What are the signs or symptoms? Symptoms of this condition  often develop 2-3 days after getting contrast media. Symptoms may include:  Feeling tired (fatigue).  Appetite loss.  Swelling of the feet or ankles.  Puffy, swollen eyes.  Dry, itchy skin. In some cases, there are no symptoms. How is this diagnosed? This condition may be diagnosed based on:  Your symptoms and medical history. Your health care provider may suspect CIN if you recently had an imaging test or procedure that included the use of contrast media.  A physical exam.  Blood and urine tests to check for kidney damage. How is this treated? There is no specific treatment for CIN. If you develop CIN, you may be given fluids through an IV that is inserted into one of your veins. This will help keep your kidneys active. Electrolytes may be added to the fluid if you have an electrolyte imbalance. In most cases, CIN will get better in about 2 weeks. In rare cases, you may need kidney dialysis if kidney failure occurs and urine flow decreases. Even if you need dialysis, the condition usually improves over time. Follow these instructions at home:   Take over-the-counter and prescription medicines only as told by your health care provider. Over-the-counter NSAIDs, such as aspirin or ibuprofen, can increase your risk of CIN.  Return to your normal activities as told by your health care provider. Ask your health care provider what activities are safe for you.  Drink enough fluid to keep your urine pale yellow.  Always let a health care provider know that you have a history of CIN before having any test or procedure that may include contrast material.  Keep all follow-up visits as told by your health care provider. This is important. How is this prevented? Take the following steps to help reduce your risk for developing CIN:  Tell your health care provider about any risk factors you have for CIN.  Ask if a contrast medium is necessary for imaging tests or procedures you are scheduled  to have. Ask if there is an alternative to the test.  Make sure you drink plenty of fluids prior to any imaging tests or procedures that require contrast medium.  If possible, do not undergo multiple imaging tests or procedures that require contrast medium unless enough time has passed between tests. Contact a health care provider if:  You have any signs or symptoms of CIN.  You are urinating less than usual. Summary  Contrast-induced nephropathy (CIN) is a rare type of kidney damage caused by contrast medium.  The exact cause of CIN is not known. In some cases, there may be more than one cause.  Sometimes CIN does not cause any symptoms. If you do have symptoms, they may start 2-3 days after getting contrast medium.  To confirm the diagnosis, you may have blood tests and urine tests to check for kidney damage.  There is no specific treatment for CIN. In most cases, this condition will usually get better in about   2 weeks. You may be given IV fluids with electrolytes. In rare cases, dialysis is needed. This information is not intended to replace advice given to you by your health care provider. Make sure you discuss any questions you have with your health care provider. Document Revised: 07/25/2017 Document Reviewed: 07/25/2017 Elsevier Patient Education  2020 Elsevier Inc.   [place] Laser Prostate Treatment  Green light laser therapy is a procedure that uses a high-energy laser to get rid of extra prostate tissue by turning the tissue into a vapor. It is less invasive than traditional methods of prostate surgery, which involve cutting out the prostate tissue. Because the tissue is turned into a vapor (vaporized) rather than cut out, there is generally less blood loss. This surgery is used to treat an enlarged prostate gland (benign prostatic hyperplasia). Tell a health care provider about:  Any allergies you have.  All medicines you are taking, including vitamins, herbs, eye  drops, creams, and over-the-counter medicines.  Any problems you or family members have had with anesthetic medicines.  Any blood disorders you have.  Any surgeries you have had.  Any medical conditions you have. What are the risks? Generally, this is a safe procedure. However, problems may occur, including:  Infection.  Bleeding.  Allergic reaction to medicines.  Damage to other structures or organs.  Blood in the urine (hematuria).  Painful urination.  Urinary tract infection.  Erectile dysfunction (rare).  Dry ejaculation.  Scar tissue in the urinary passage. What happens before the procedure? Staying hydrated Follow instructions from your health care provider about hydration, which may include:  Up to 2 hours before the procedure - you may continue to drink clear liquids, such as water, clear fruit juice, black coffee, and plain tea. Eating and drinking restrictions Follow instructions from your health care provider about eating and drinking, which may include:  8 hours before the procedure - stop eating heavy meals or foods such as meat, fried foods, or fatty foods.  6 hours before the procedure - stop eating light meals or foods, such as toast or cereal.  6 hours before the procedure - stop drinking milk or drinks that contain milk.  2 hours before the procedure - stop drinking clear liquids. Medicines  Ask your health care provider about: ? Changing or stopping your regular medicines. This is especially important if you are taking diabetes medicines or blood thinners. ? Taking medicines such as aspirin and ibuprofen. These medicines can thin your blood. Do not take these medicines before your procedure if your health care provider instructs you not to.  You may be prescribed antibiotic medicine. If so, take your antibiotic as told by your health care provider. Do not stop taking the antibiotic even if you start to feel better. General instructions  Plan  to have someone take you home from the hospital or clinic.  If you will be going home right after the procedure, plan to have someone with you for 24 hours. What happens during the procedure?  To reduce your risk of infection: ? Your health care team will wash or sanitize their hands. ? Your skin will be washed with soap.  An IV will be inserted into one of your veins.  You will be given one or more of the following: ? A medicine to help you relax (sedative). ? A medicine to make you fall asleep (general anesthetic). ? A medicine that is injected into your spine to numb the area below and slightly above  the injection site (spinal anesthetic).  A tube containing viewing scopes and instruments (fiber-optic scope) will be passed through the urethra and into the prostate. The opening of the urethra is at the end of the penis.  A thin fiber will be put through the tube and positioned next to the extra prostate tissue.  Pulses of laser light will come from the end of the fiber and be projected onto the extra tissue. Your blood will absorb the light, become hot, and vaporize the extra prostate tissue.  The heat from the laser beam will seal off the blood vessels, which will lessen bleeding.  The fiber-optic scope will be removed.  A thin tube will be inserted into the urethra to drain your urine (urinary catheter). The procedure may vary among health care providers and hospitals. What happens after the procedure?  Your blood pressure, heart rate, breathing rate, and blood oxygen level will be monitored until the medicines you were given have worn off.  Depending on factors such as the amount of prostate tissue that was vaporized, the strength of your bladder, and the amount of bleeding expected, your catheter may be removed before you go home.  You may be given elastic support stockings to wear to help prevent blood clots in your legs.  Do not drive for 24 hours if you were given a  sedative, or for as long as directed by your health care provider. Summary  Green light laser therapy is a procedure that uses a high-energy laser that turns extra prostate tissue into a vapor.  This procedure is less invasive than traditional methods of prostate surgery.  Follow instructions from your health care provider about eating and drinking before the procedure.  Pulses of laser light will come from the end of a thin fiber and be aimed at the extra prostate tissue. Your blood will absorb the light, become hot, and vaporize the extra tissue. This information is not intended to replace advice given to you by your health care provider. Make sure you discuss any questions you have with your health care provider. Document Revised: 08/03/2018 Document Reviewed: 05/01/2016 Elsevier Patient Education  [place].   Benign Prostatic Hyperplasia  Benign prostatic hyperplasia (BPH) is an enlarged prostate gland that is caused by the normal aging process and not by cancer. The prostate is a walnut-sized gland that is involved in the production of semen. It is located in front of the rectum and below the bladder. The bladder stores urine and the urethra is the tube that carries the urine out of the body. The prostate may get bigger as a man gets older. An enlarged prostate can press on the urethra. This can make it harder to pass urine. The build-up of urine in the bladder can cause infection. Back pressure and infection may progress to bladder damage and kidney (renal) failure. What are the causes? This condition is part of a normal aging process. However, not all men develop problems from this condition. If the prostate enlarges away from the urethra, urine flow will not be blocked. If it enlarges toward the urethra and compresses it, there will be problems passing urine. What increases the risk? This condition is more likely to develop in men over the age of 29 years. What are the signs  or symptoms? Symptoms of this condition include:  Getting up often during the night to urinate.  Needing to urinate frequently during the day.  Difficulty starting urine flow.  Decrease in size and  strength of your urine stream.  Leaking (dribbling) after urinating.  Inability to pass urine. This needs immediate treatment.  Inability to completely empty your bladder.  Pain when you pass urine. This is more common if there is also an infection.  Urinary tract infection (UTI). How is this diagnosed? This condition is diagnosed based on your medical history, a physical exam, and your symptoms. Tests will also be done, such as:  A post-void bladder scan. This measures any amount of urine that may remain in your bladder after you finish urinating.  A digital rectal exam. In a rectal exam, your health care provider checks your prostate by putting a lubricated, gloved finger into your rectum to feel the back of your prostate gland. This exam detects the size of your gland and any abnormal lumps or growths.  An exam of your urine (urinalysis).  A prostate specific antigen (PSA) screening. This is a blood test used to screen for prostate cancer.  An ultrasound. This test uses sound waves to electronically produce a picture of your prostate gland. Your health care provider may refer you to a specialist in kidney and prostate diseases (urologist). How is this treated? Once symptoms begin, your health care provider will monitor your condition (active surveillance or watchful waiting). Treatment for this condition will depend on the severity of your condition. Treatment may include:  Observation and yearly exams. This may be the only treatment needed if your condition and symptoms are mild.  Medicines to relieve your symptoms, including: ? Medicines to shrink the prostate. ? Medicines to relax the muscle of the prostate.  Surgery in severe cases. Surgery may include: ? Prostatectomy. In  this procedure, the prostate tissue is removed completely through an open incision or with a laparoscope or robotics. ? Transurethral resection of the prostate (TURP). In this procedure, a tool is inserted through the opening at the tip of the penis (urethra). It is used to cut away tissue of the inner core of the prostate. The pieces are removed through the same opening of the penis. This removes the blockage. ? Transurethral incision (TUIP). In this procedure, small cuts are made in the prostate. This lessens the prostate's pressure on the urethra. ? Transurethral microwave thermotherapy (TUMT). This procedure uses microwaves to create heat. The heat destroys and removes a small amount of prostate tissue. ? Transurethral needle ablation (TUNA). This procedure uses radio frequencies to destroy and remove a small amount of prostate tissue. ? Interstitial laser coagulation ([place]). This procedure uses a laser to destroy and remove a small amount of prostate tissue. ? Transurethral electrovaporization (TUVP). This procedure uses electrodes to destroy and remove a small amount of prostate tissue. ? Prostatic urethral lift. This procedure inserts an implant to push the lobes of the prostate away from the urethra. Follow these instructions at home:  Take over-the-counter and prescription medicines only as told by your health care provider.  Monitor your symptoms for any changes. Contact your health care provider with any changes.  Avoid drinking large amounts of liquid before going to bed or out in public.  Avoid or reduce how much caffeine or alcohol you drink.  Give yourself time when you urinate.  Keep all follow-up visits as told by your health care provider. This is important. Contact a health care provider if:  You have unexplained back pain.  Your symptoms do not get better with treatment.  You develop side effects from the medicine you are taking.  Your urine becomes very  dark or has a  bad smell.  Your lower abdomen becomes distended and you have trouble passing your urine. Get help right away if:  You have a fever or chills.  You suddenly cannot urinate.  You feel lightheaded, or very dizzy, or you faint.  There are large amounts of blood or clots in the urine.  Your urinary problems become hard to manage.  You develop moderate to severe low back or flank pain. The flank is the side of your body between the ribs and the hip. These symptoms may represent a serious problem that is an emergency. Do not wait to see if the symptoms will go away. Get medical help right away. Call your local emergency services (911 in the [place] Do not drive yourself to the hospital. Summary  Benign prostatic hyperplasia (BPH) is an enlarged prostate that is caused by the normal aging process and not by cancer.  An enlarged prostate can press on the urethra. This can make it hard to pass urine.  This condition is part of a normal aging process and is more likely to develop in men over the age of 58 years.  Get help right away if you suddenly cannot urinate. This information is not intended to replace advice given to you by your health care provider. Make sure you discuss any questions you have with your health care provider. Document Revised: 03/07/2018 Document Reviewed: 05/17/2016 Elsevier Patient Education  [place].   Laparoscopic Prostatectomy, Care After This sheet gives you information about how to care for yourself after your procedure. Your health care provider may also give you more specific instructions. If you have problems or questions, contact your health care provider. What can I expect after the procedure? After the procedure, it is common to have:  Pain.  Abdominal discomfort.  Nausea. Follow these instructions at home: Medicines  Take over-the-counter and prescription medicines only as told by your health care provider.  If you were prescribed an  antibiotic medicine, take it as told by your health care provider. Do not stop taking the antibiotic even if you start to feel better. Activity  Increase your activity level slowly. Being active after your surgery is important because it reduces your risk of developing blood clots.  Get out of bed as much as possible, but do not do activities that require a lot of energy. Walk around as tolerated.  For the first 10 days after your procedure, avoid: ? Lifting. ? Straining. ? Running. ? Walking more than a couple of blocks. ? Driving or riding in a car for long periods of time. ? Sexual activity. Diet  Avoid alcohol and drinks with caffeine for 2 weeks. Alcohol and caffeine irritate the bladder.  Avoid spicy foods. These can irritate the bladder.  To prevent or treat constipation while you are taking prescription pain medicine, your health care provider may recommend that you: ? Drink enough fluid to keep your urine clear or pale yellow. ? Take over-the-counter or prescription medicines. ? Eat foods that are high in fiber, such as fresh fruits and vegetables, whole grains, and beans. ? Limit foods that are high in fat and processed sugars, such as fried and sweet foods. Bowel and bladder care  Follow your health care provider's instructions about caring for your Foley catheter.  Urinate when you feel the need to. Do not hold in your urine for long periods of time.  Do not strain to have a bowel movement. Straining increases the chances  of bleeding. Incision care   Follow instructions from your health care provider about how to take care of your incisions. Make sure you: ? Wash your hands with soap and water before you change your bandage (dressing). If soap and water are not available, use hand sanitizer. ? Change your dressing as told by your health care provider. ? Leave stitches (sutures) or adhesive strips in place. These skin closures may need to stay in place for 2 weeks or  longer. If adhesive strip edges start to loosen and curl up, you may trim the loose edges. Do not remove adhesive strips completely unless your health care provider tells you to do that.  Check your incision area every day for signs of infection. Check for: ? Redness, swelling, or pain. ? Fluid or blood discharge. ? Warmth. ? Pus or a bad smell. General instructions  Do coughing and deep breathing exercises as told by your health care provider. It may be helpful to take your pain medicines before doing these exercises.  Do not use any products that contain nicotine or tobacco, such as cigarettes and e-cigarettes. If you need help quitting, ask your health care provider.  Keep all follow-up visits as told by your health care provider. This is important. Contact a health care provider if:  You have redness, swelling, or pain around an incision.  You have more fluid or blood coming from an incision.  An incision feels warm to the touch.  You have pus or a bad smell coming from an incision.  You have a fever.  An incision breaks open before or after sutures or staples have been removed. Get help right away if:  Your catheter stops draining urine.  Your abdomen becomes swollen.  You develop shortness of breath.  You have a lot of bleeding from your incision.  You have a high fever that does not go away.  You develop pain in your chest, back, or abdomen.  You develop pain or swelling in your legs.  You suddenly gain weight. Summary  After your procedure, it is common to have pain, abdominal discomfort, and nausea.  Take over-the-counter and prescription medicines only as told by your health care provider. Finish all the prescribed antibiotic medicines, even if you start to feel better.  Increase your activity level slowly to prevent blood clots.  Follow your health care provider's instructions about how to take care of your incisions.  Get help right away if you  develop shortness of breath, or if you develop chest pain. This information is not intended to replace advice given to you by your health care provider. Make sure you discuss any questions you have with your health care provider. Document Revised: 03/25/2017 Document Reviewed: 03/17/2016 Elsevier Patient Education  2020 [employer].

## 2020-01-06 NOTE — Discharge Summary (Addendum)
Physician Discharge Summary  Carlos Alvarez:[id number] DOB: 1945-11-01 DOA: 01/03/2020  PCP: System, Pcp Not In  [place] date: 01/03/2020 Discharge date: 01/06/2020  Time spent: 37 minutes  Recommendations for Outpatient Follow-up:  1. Needs outpatient renal panel/CBC in 1 week at PCP office 2. Note dosage changes of some meds including losartan which may need to be reimplemented am calling in some amlodipine this admission for blood pressure control 3. We will CC Dr. Abner Greenspan of urology for outpatient coordination of definitive management of nephrolithiasis and need for further surgery as per him 4. Foley to be kept on discharge and patient educated regarding Foley care prior to discharge by RN 5. Will need outpatient therapy-rolling walker has been ordered and prescription has been given for the therapy  Discharge Diagnoses:  Principal Problem:   ARF (acute renal failure) ([place]) Active Problems:   Urinary retention   Bilateral hydronephrosis   Left renal stone   Bladder stones   Gross hematuria   BPH with obstruction/lower urinary tract symptoms   Acute lower UTI   Essential hypertension   Fall   Leukocytosis   Discharge Condition: Improved  Diet recommendation: Diabetic heart healthy  Filed Weights   01/03/20 1037 01/03/20 1703  Weight: 104.3 kg 102.2 kg    History of present illness:  74 year old male known DM TY 2, HTN, HLD, scoliosis Also of severe osteoarthritis left knee  Status post accidental fall 01/03/2020-hit forehead-no LOC-EMS called but patient declined transport Subsequently severe back pain-ancillary information = decreased UOP increased nocturia  Transported to emergency room white count 11 BUNs/creatinine 63/5.0 CT back = bilateral hydroureteral nephrosis + multiple calculi + large left renal calculus-Foley placed yielding approximately 5 L fluid MRI back performed = no acute fracture Has prior history of urolithiasis status post Rx 40  years prior With constellation of findings urologist Dr. Rexene Alberts consulted and Foley catheter placed with immediate return of 5 L of urine  Hospital Course:  1. Acute urinary retention?  Bladder outlet obstruction prostatomegaly as well as  Large left renal calculus + bladder stones a. Leave Foley in place as per urologist- b. Started this admission and will continue Flomax 0.4 finasteride 5 mg-nursing to educate regarding leg bag etc. c. Outpatient follow-up for?  Cystolitholopaxy/small bladder stone and left renal calculus operative intervention 2. AKI on admission (baseline creatinine is 1.2 from Dr. Mathis Fare records) cyst bilatera Hydroureteronephrosis + traumatic hematuria from Foley a. AKI resolving- fluid stopped. b. Noncontrast CT ABD pelvis repeated on 9/12 negative for hydronephrosis 3. Low back pain with fall on 9/8 4. Underlying left severe knee arthritis a. Therapy to consult given fall and facial abrasion for safety as he uses a cane at baseline b. Therapy recommends outpatient PT with rolling walker c. Aspercreme for left knee 5. Hypokalemia 1. We will: Prescription of K. Dur 40 twice daily and needs repeat labs in about a week 6. Normocytic anemia with dilutional component a. Likely secondary to hemodilution b. No further work-up 7. Leukocytosis on admission?  UTI a. Urine culture 9/9 is negative b. Stop all antibiotics c. DM TY 2 A1c 5.8  d. Resumed on d/c cauiously Metformin XR 500 8. HTN a. Hold losartan 100 at this time would not resume until seen at PCP office b. Placed on amlodipine 5 this admission and added back Atenolo0l -chlothal on d/c 9. HLD 10. Scoliosis 11. BMI 39 with moderate to severe obesity   Procedures:  Foley catheter placement  Consultations:  Dr. Rexene Alberts urologist  Discharge Exam: Vitals:   01/05/20 2346 01/06/20 0552  BP: (!) 153/61 (!) 161/63  Pulse: 71 73  Resp: 16 16  Temp: 98.1 F (36.7 C) 98.7 F (37.1 C)   SpO2: 92% 93%    General: Very pleasant alert moving around no distress EOMI NCAT Cardiovascular: S1-S2 no murmur on exam not on monitors Respiratory: Clear no added sound no rales no rhonchi Abdomen soft no rebound no guarding  Discharge Instructions   Discharge Instructions    Diet - low sodium heart healthy   Complete by: As directed    Discharge instructions   Complete by: As directed    You had slightly low potassium this admission and we will give the replacement of this and you will need recheck labs in the outpatient setting Because of your kidney issues that you had from the blockage of your ureters, you develop some kidney damage that is hopefully reversible-however because of this some your medications might have changed such as your blood pressure meds losartan which have replaced with amlodipine-for now you can take your atenolol chlorthalidone which is reasonable but you will still need labs as we discussed to ensure that everything resolves You will need teaching about the Foley catheter prior to going home and learning how to use a leg bag and the urologist who saw you will follow up with you in the outpatient setting for voiding trial in the outpatient setting-I will provide you with his number and cc that urologist Dr. Abner Greenspan to ensure that you are not lost to follow-up-please call his office if you do not hear back within 3 to 4 days We will also provide you with a cream for your leg Was a pleasure meeting you take care of yourself and good luck   Increase activity slowly   Complete by: As directed      Allergies as of 01/06/2020   No Known Allergies     Medication List    STOP taking these medications   losartan 100 MG tablet Commonly known as: COZAAR   multivitamin with minerals Tabs tablet   naproxen sodium 220 MG tablet Commonly known as: ALEVE   VITAMIN E PO     TAKE these medications   acetaminophen 500 MG tablet Commonly known as: TYLENOL Take 500  mg by mouth every 6 (six) hours as needed for mild pain or headache.   amLODipine 5 MG tablet Commonly known as: NORVASC Take 1 tablet (5 mg total) by mouth daily.   atenolol-chlorthalidone 50-25 MG tablet Commonly known as: TENORETIC Take 1 tablet by mouth daily.   finasteride 5 MG tablet Commonly known as: PROSCAR Take 1 tablet (5 mg total) by mouth daily.   GARLIC PO Take 1 tablet by mouth daily.   metFORMIN 500 MG 24 hr tablet Commonly known as: GLUCOPHAGE-XR Take 1,000 mg by mouth 2 (two) times daily.   Muscle Rub 10-15 % Crea Apply 1 application topically as needed for muscle pain.   potassium chloride SA 20 MEQ tablet Commonly known as: KLOR-CON Take 2 tablets (40 mEq total) by mouth 2 (two) times daily.   tamsulosin 0.4 MG Caps capsule Commonly known as: FLOMAX Take 1 capsule (0.4 mg total) by mouth daily after supper.            Durable Medical Equipment  (From admission, onward)         Start     Ordered   01/04/20 1559  For home  use only DME Walker rolling  Once       Comments: YOUTH  Question Answer Comment  Walker: With 5 Inch Wheels   Patient needs a walker to treat with the following condition OA (osteoarthritis) of knee      01/04/20 1559         No Known Allergies    The results of significant diagnostics from this hospitalization (including imaging, microbiology, ancillary and laboratory) are listed below for reference.    Significant Diagnostic Studies: CT Head Wo Contrast  Result Date: 01/03/2020 CLINICAL DATA:  Head trauma EXAM: CT HEAD WITHOUT CONTRAST TECHNIQUE: Contiguous axial images were obtained from the base of the skull through the vertex without intravenous contrast. COMPARISON:  11/16/2003 FINDINGS: Brain: No evidence of acute infarction, hemorrhage, hydrocephalus, extra-axial collection or mass lesion/mass effect. Periventricular and deep white matter hypodensity. Vascular: No hyperdense vessel or unexpected calcification.  Skull: Normal. Negative for fracture or focal lesion. Sinuses/Orbits: No acute finding. 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 IMPRESSION: No acute intracranial pathology. Small-vessel white matter disease. Electronically Signed   By: Eddie Candle M.D.   On: 01/03/2020 09:31   CT Lumbar Spine Wo Contrast  Result Date: 01/03/2020 CLINICAL DATA:  Low back pain after trauma. EXAM: CT LUMBAR SPINE WITHOUT CONTRAST TECHNIQUE: Multidetector CT imaging of the lumbar spine was performed without intravenous contrast administration. Multiplanar CT image reconstructions were also generated. COMPARISON:  None. FINDINGS: Segmentation: Normal appearing lumbosacral junction but when numbering in this fashion there are ribs at L1. Without thoracic numbering, this is the employed numbering scheme. Alignment: Marked levoscoliosis.  Grade 1 anterolisthesis at L4-5. Vertebrae: No acute fracture. Paraspinal and other soft tissues: Marked distension of the bladder bilateral hydroureteronephrosis. Numerous calculi layering in the bladder. There is also left nephrolithiasis with stone measuring 17 mm. Disc levels: Diffuse facet osteoarthritis with bulky right-sided spurring at the thoracolumbar junction and bilateral spurring at L3-4 and below. There are bridging intervertebral osteophytes at L4-5 and L5-S1. Right-sided foraminal impingement at L1-2 to L3-4 due to facet spurring and disc narrowing. High-grade spinal stenosis, especially towards the right, seen at L3-4. IMPRESSION: 1. No acute fracture. 2. Scoliosis with advanced degenerative disease. Spinal stenosis is high-grade at L3-4. 3. Over distended bladder with bilateral hydroureteronephrosis. There are numerous calculi layering within the bladder and a large left renal calculus. Electronically Signed   By: Monte Fantasia M.D.   On: 01/03/2020 09:38   MR HIP LEFT WO CONTRAST  Result Date: 01/03/2020 CLINICAL  DATA:  Left hip pain since a fall 2 days ago. Initial encounter. EXAM: MR OF THE LEFT HIP WITHOUT CONTRAST TECHNIQUE: Multiplanar, multisequence MR imaging was performed. No intravenous contrast was administered. COMPARISON:  Plain films of the left hip today. FINDINGS: Bones: There is no fracture or worrisome lesion. Small sclerotic focus in the superior margin of the left femoral neck seen on plain films has benign features and may be an enchondroma. No worrisome marrow lesion is identified. There is mild degenerative disease about the hips. Moderate SI joint osteoarthritis is also seen. Convex left lumbar curvature may be positional. Articular cartilage and labrum Articular cartilage:  Mildly degenerated. Labrum:  Appears intact. Joint or bursal effusion Joint effusion:  None. Bursae: Negative. Muscles and tendons Muscles and tendons:  Intact. Other findings Miscellaneous: The prostate is visualized in the coronal plane only and measures approximately 8 cm craniocaudal by 8 cm transverse. Foley catheter is in  place in the urinary bladder. There is air in the bladder likely from catheterization. Walls of the bladder are thickened. IMPRESSION: Negative for fracture. No acute abnormality. Small sclerotic focus in the superior aspect of the left femoral neck on plain films has benign features and may be a enchondroma. No follow-up imaging is recommended. Massive prostatomegaly. Although the bladder is incompletely distended, its walls are markedly thickened compatible with bladder outlet obstruction and/or cystitis. Convex left lumbar curvature is partially imaged and may be positional. Electronically Signed   By: Inge Rise M.D.   On: 01/03/2020 12:40   DG Knee Complete 4 Views Left  Result Date: 01/03/2020 CLINICAL DATA:  Fall 2 days ago with left knee pain, initial encounter EXAM: LEFT KNEE - COMPLETE 4+ VIEW COMPARISON:  None. FINDINGS: Tricompartmental degenerative changes are noted. These are most  noted in the medial joint space. No joint effusion is seen. No acute fracture or dislocation is seen. IMPRESSION: Degenerative change without acute abnormality. Electronically Signed   By: Inez Catalina M.D.   On: 01/03/2020 09:15   DG Hip Unilat W or Wo Pelvis 2-3 Views Left  Addendum Date: 01/03/2020   ADDENDUM REPORT: 01/03/2020 09:44 ADDENDUM: As additional information on the current study there is sclerosis along the superior margin of the LEFT femoral neck, interrupting trabecular pattern,, this is of uncertain significance. This may represent a stress fracture or nondisplaced fracture though could also represent a synovial herniation pit. Based on the limitations of the current exam if there is high clinical suspicion for fracture CT or MRI assessment may be helpful. These results were called by telephone at the time of interpretation on 01/03/2020 at 9:43 am to provider Dr Percell Miller, who verbally acknowledged these results. Electronically Signed   By: Zetta Bills M.D.   On: 01/03/2020 09:44   Result Date: 01/03/2020 CLINICAL DATA:  Fall 2 days ago evaluate for fracture EXAM: DG HIP (WITH OR WITHOUT PELVIS) 2-3V LEFT COMPARISON:  None FINDINGS: Degenerative changes about the hips. Study limited by patient body habitus without sign of fracture or dislocation. Marked degenerative changes in the lumbar spine are incidentally noted. No sign of pelvic fracture. IMPRESSION: Degenerative changes without signs of fracture or dislocation. Electronically Signed: By: Zetta Bills M.D. On: 01/03/2020 09:14    Microbiology: Recent Results (from the past 240 hour(s))  Urine culture     Status: None   Collection Time: 01/03/20 10:30 AM   Specimen: Urine, Catheterized  Result Value Ref Range Status   Specimen Description   Final    URINE, CATHETERIZED Performed at [facility] [address] [place], [place] [address]    Special Requests   Final    NONE Performed at [facility], [place] [address] [place], [place] [address]    Culture   Final    NO GROWTH Performed at [facility], [place] [address] [place], [place] [address]    Report Status 01/04/2020 FINAL  Final  SARS Coronavirus 2 by RT PCR (hospital order, performed in [facility] hospital lab) Nasopharyngeal Nasopharyngeal Swab     Status: None   Collection Time: 01/03/20  1:58 PM   Specimen: Nasopharyngeal Swab  Result Value Ref Range Status   SARS Coronavirus 2 NEGATIVE NEGATIVE Final    Comment: (NOTE) SARS-CoV-2 target nucleic acids are NOT DETECTED.  The SARS-CoV-2 RNA is generally detectable in upper and lower respiratory specimens during the acute phase of infection. The lowest concentration of  SARS-CoV-2 viral copies this assay can detect is 250 copies / mL. A negative result does not preclude SARS-CoV-2 infection and should not be used as the sole basis for treatment or other patient management decisions.  A negative result may occur with improper specimen collection / handling, submission of specimen other than nasopharyngeal swab, presence of viral mutation(s) within the areas targeted by this assay, and inadequate number of viral copies (<250 copies / mL). A negative result must be combined with clinical observations, patient history, and epidemiological information.  Fact Sheet for Patients:   StrictlyIdeas.no  Fact Sheet for Healthcare Providers: BankingDealers.co.za  This test is not yet approved or  cleared by the [place] FDA and has been authorized for detection and/or diagnosis of SARS-CoV-2 by FDA under an Emergency Use Authorization (EUA).  This EUA will remain in effect (meaning this test can be used) for the duration of the COVID-19 declaration under Section 564(b)(1) of the Act, 21 U.S.C. section 360bbb-3(b)(1), unless the authorization is terminated or revoked sooner.  Performed at [facility], [place] [address], [place], [place] [address]      Labs: Basic Metabolic Panel: Recent Labs  Lab 01/03/20 1036 01/04/20 0541 01/05/20 0859 01/06/20 0711  NA 135 145 141 140  K 4.4 4.0 3.3* 3.1*  CL 98 104 101 99  CO2 23 26 26 27   GLUCOSE 130* 124* 185* 121*  BUN 63* 51* 30* 20  CREATININE 5.07* 2.85* 1.66* 1.32*  CALCIUM 9.0 8.8* 8.5* 8.4*   Liver Function Tests: Recent Labs  Lab 01/05/20 0859 01/06/20 0711  AST 19 20  ALT 25 25  ALKPHOS 46 44  BILITOT 0.8 0.6  PROT 6.6 6.5  ALBUMIN 3.1* 3.0*   No results for input(s): LIPASE, AMYLASE in the last 168 hours. No results for input(s): AMMONIA in the last 168 hours. CBC: Recent Labs  Lab 01/03/20 1036 01/04/20 0541 01/06/20 0711  WBC 11.8* 9.6 8.0  NEUTROABS 9.2*  --  4.7  HGB 11.7* 10.9* 10.8*  HCT 35.6* 32.7* 32.9*  MCV 93.9 94.0 94.5  PLT 287 257 276   Cardiac Enzymes: Recent Labs  Lab 01/03/20 1036  CKTOTAL 105   BNP: BNP (last 3 results) No results for input(s): BNP in the last 8760 hours.  ProBNP (last 3 results) No results for input(s): PROBNP in the last 8760 hours.  CBG: No results for input(s): GLUCAP in the last 168 hours.     Signed:  Nita Sells MD   [facility] 01/06/2020, 10:02 AM

## 2020-01-06 NOTE — Progress Notes (Signed)
Physical Therapy Treatment Patient Details Name: Carlos Alvarez MRN: [id number] DOB: 19-Apr-1946 Today's Date: 01/06/2020    History of Present Illness Pt admitted s/p fall and dx with acute renal failure, renal retention, UTI.  Pt with hx of DM, scoliosis, severe L knee OA, and obesity    PT Comments    Pt up to ambulate in hall and negotiate stairs with noted improvement in stability and activity tolerance.   Follow Up Recommendations  Outpatient PT     Equipment Recommendations  Rolling walker with 5" wheels (youth level)    Recommendations for Other Services       Precautions / Restrictions Precautions Precautions: Fall Restrictions Weight Bearing Restrictions: No    Mobility  Bed Mobility               General bed mobility comments: Pt up in chair and requests back to same  Transfers Overall transfer level: Needs assistance Equipment used: Rolling walker (2 wheeled) Transfers: Sit to/from Stand Sit to Stand: Supervision            Ambulation/Gait Ambulation/Gait assistance: Supervision Gait Distance (Feet): 130 Feet Assistive device: Rolling walker (2 wheeled) Gait Pattern/deviations: Step-to pattern;Step-through pattern;Decreased step length - right;Decreased step length - left;Shuffle Gait velocity: decr   General Gait Details: min cues for posture and position from RW.    Stairs Stairs: Yes Stairs assistance: Min guard Stair Management: One rail Right;Step to pattern;Forwards;With cane Number of Stairs: 3 General stair comments: min cues for sequence and cane/foot placement   Wheelchair Mobility    Modified Rankin (Stroke Patients Only)       Balance Overall balance assessment: Needs assistance Sitting-balance support: No upper extremity supported;Feet supported Sitting balance-Leahy Scale: Good     Standing balance support: No upper extremity supported Standing balance-Leahy Scale: Fair                               Cognition Arousal/Alertness: Awake/alert Behavior During Therapy: WFL for tasks assessed/performed Overall Cognitive Status: Within Functional Limits for tasks assessed                                        Exercises      General Comments        Pertinent Vitals/Pain Pain Assessment: 0-10 Pain Score: 2  Pain Location: L knee with ambulation Pain Descriptors / Indicators: Aching;Sore Pain Intervention(s): Limited activity within patient's tolerance;Monitored during session    Home Living                      Prior Function            PT Goals (current goals can now be found in the care plan section) Acute Rehab PT Goals Patient Stated Goal: Regain IND PT Goal Formulation: With patient Time For Goal Achievement: 01/18/20 Potential to Achieve Goals: Good Progress towards PT goals: Progressing toward goals    Frequency    Min 3X/week      PT Plan Current plan remains appropriate    Co-evaluation              AM-PAC PT "6 Clicks" Mobility   Outcome Measure  Help needed turning from your back to your side while in a flat bed without using bedrails?: A Little Help needed moving from lying on your  back to sitting on the side of a flat bed without using bedrails?: A Little Help needed moving to and from a bed to a chair (including a wheelchair)?: A Little Help needed standing up from a chair using your arms (e.g., wheelchair or bedside chair)?: A Little Help needed to walk in hospital room?: A Little Help needed climbing 3-5 steps with a railing? : A Little 6 Click Score: 18    End of Session Equipment Utilized During Treatment: Gait belt Activity Tolerance: Patient tolerated treatment well Patient left: in chair;with call bell/phone within reach Nurse Communication: Mobility status PT Visit Diagnosis: Unsteadiness on feet (R26.81);Muscle weakness (generalized) (M62.81);History of falling (Z91.81);Difficulty in walking, not  elsewhere classified (R26.2);Pain Pain - Right/Left: Left Pain - part of body: Knee     Time: 2244-9753 PT Time Calculation (min) (ACUTE ONLY): 22 min  Charges:  $Gait Training: 8-22 mins                     [place] Pager [phone] Office [phone]    Garlin Batdorf 01/06/2020, 11:26 AM

## 2020-01-06 NOTE — Progress Notes (Signed)
Discharge instructions explained to patient. Next medications written on discharge papers. Carlos Alvarez was shown how to use the leg bag and instructed to use the larger bag at night time because it holds more. His walker was delivered to his room. Foley bad emptied before being discharged. Extra foley bags given to the patient. Prescriptions were called in to his pharmacy. Patient was discharged via wheelchair and his daughter picked him up to take him home.

## 2020-01-06 NOTE — TOC Initial Note (Signed)
Transition of Care [facility]) - Initial/Assessment Note    Patient Details  Name: Carlos Alvarez MRN: [id number] Date of Birth: 1946-01-22  Transition of Care [facility] CM/SW Contact:    Joaquin Courts, RN Phone Number: 01/06/2020, 11:10 AM  Clinical Narrative:    [employer] will provide dme rolling walker youth size.  Bedside RN has script for outpatient pt for patient.  Patient can go to any of the [facility] pt clinics for services.                Expected Discharge Plan: Home/Self Care Barriers to Discharge: No Barriers Identified   Patient Goals and CMS Choice Patient states their goals for this hospitalization and ongoing recovery are:: to go home      Expected Discharge Plan and Services Expected Discharge Plan: Home/Self Care   Discharge Planning Services: CM Consult   Living arrangements for the past 2 months: Single Family Home Expected Discharge Date: 01/06/20               DME Arranged: Gilford Rile rolling DME Agency: [employer] Date DME Agency Contacted: 01/06/20 Time DME Agency Contacted: [id number] Representative spoke with at DME Agency: patricia            Prior Living Arrangements/Services Living arrangements for the past 2 months: [place]   Patient language and need for interpreter reviewed:: Yes Do you feel safe going back to the place where you live?: Yes      Need for Family Participation in Patient Care: Yes (Comment) Care giver support system in place?: Yes (comment)   Criminal Activity/Legal Involvement Pertinent to Current Situation/Hospitalization: No - Comment as needed  Activities of Daily Living Home Assistive Devices/Equipment: Eyeglasses, CBG Meter, Cane (specify quad or straight) (single point cane) ADL Screening (condition at time of admission) Patient's cognitive ability adequate to safely complete daily activities?: Yes Is the patient deaf or have difficulty hearing?: No Does the patient have difficulty seeing, even when  wearing glasses/contacts?: No Does the patient have difficulty concentrating, remembering, or making decisions?: No Patient able to express need for assistance with ADLs?: Yes Does the patient have difficulty dressing or bathing?: No Independently performs ADLs?: Yes (appropriate for developmental age) Does the patient have difficulty walking or climbing stairs?: Yes (secondary to weakness) Weakness of Legs: Both Weakness of Arms/Hands: None  Permission Sought/Granted                  Emotional Assessment Appearance:: Appears stated age         Psych Involvement: No (comment)  Admission diagnosis:  Bladder distension [N32.89] ARF (acute renal failure) ([place]) [N17.9] Enlarged prostate [N40.0] AKI (acute kidney injury) ([place]) [N17.9] Fall, initial encounter [W19.XXXA] Patient Active Problem List   Diagnosis Date Noted  . ARF (acute renal failure) ([place]) 01/03/2020  . Urinary retention 01/03/2020  . Bilateral hydronephrosis 01/03/2020  . Left renal stone 01/03/2020  . Bladder stones 01/03/2020  . Gross hematuria 01/03/2020  . BPH with obstruction/lower urinary tract symptoms 01/03/2020  . Acute lower UTI 01/03/2020  . Essential hypertension 01/03/2020  . Fall 01/03/2020  . Leukocytosis 01/03/2020   PCP:  System, Pcp Not In Pharmacy:   [place], [place] [address] Phone: [phone] Fax: [phone]     Social Determinants of Health (SDOH) Interventions    Readmission Risk Interventions No flowsheet data  found.  

## 2020-01-22 DIAGNOSIS — N401 Enlarged prostate with lower urinary tract symptoms: Secondary | ICD-10-CM | POA: Diagnosis not present

## 2020-01-22 DIAGNOSIS — N210 Calculus in bladder: Secondary | ICD-10-CM | POA: Diagnosis not present

## 2020-01-22 DIAGNOSIS — N200 Calculus of kidney: Secondary | ICD-10-CM | POA: Diagnosis not present

## 2020-01-22 DIAGNOSIS — R338 Other retention of urine: Secondary | ICD-10-CM | POA: Diagnosis not present

## 2020-01-29 DIAGNOSIS — E1121 Type 2 diabetes mellitus with diabetic nephropathy: Secondary | ICD-10-CM | POA: Diagnosis not present

## 2020-01-29 DIAGNOSIS — I10 Essential (primary) hypertension: Secondary | ICD-10-CM | POA: Diagnosis not present

## 2020-01-29 DIAGNOSIS — M419 Scoliosis, unspecified: Secondary | ICD-10-CM | POA: Diagnosis not present

## 2020-01-29 DIAGNOSIS — E1165 Type 2 diabetes mellitus with hyperglycemia: Secondary | ICD-10-CM | POA: Diagnosis not present

## 2020-01-29 DIAGNOSIS — Z01818 Encounter for other preprocedural examination: Secondary | ICD-10-CM | POA: Diagnosis not present

## 2020-01-29 DIAGNOSIS — M170 Bilateral primary osteoarthritis of knee: Secondary | ICD-10-CM | POA: Diagnosis not present

## 2020-01-29 DIAGNOSIS — E782 Mixed hyperlipidemia: Secondary | ICD-10-CM | POA: Diagnosis not present

## 2020-02-04 ENCOUNTER — Other Ambulatory Visit: Payer: Self-pay | Admitting: Urology

## 2020-02-05 DIAGNOSIS — E1165 Type 2 diabetes mellitus with hyperglycemia: Secondary | ICD-10-CM | POA: Diagnosis not present

## 2020-02-05 DIAGNOSIS — E538 Deficiency of other specified B group vitamins: Secondary | ICD-10-CM | POA: Diagnosis not present

## 2020-02-05 DIAGNOSIS — I10 Essential (primary) hypertension: Secondary | ICD-10-CM | POA: Diagnosis not present

## 2020-02-05 DIAGNOSIS — E1169 Type 2 diabetes mellitus with other specified complication: Secondary | ICD-10-CM | POA: Diagnosis not present

## 2020-02-05 DIAGNOSIS — E782 Mixed hyperlipidemia: Secondary | ICD-10-CM | POA: Diagnosis not present

## 2020-02-14 NOTE — Patient Instructions (Addendum)
DUE TO COVID-19 ONLY ONE VISITOR IS ALLOWED TO COME WITH YOU AND STAY IN THE WAITING ROOM ONLY DURING PRE OP AND PROCEDURE DAY OF SURGERY. THE 1 VISITOR  MAY VISIT WITH YOU AFTER SURGERY IN YOUR PRIVATE ROOM DURING VISITING HOURS ONLY!  YOU NEED TO HAVE A COVID 19 TEST ON__11/2_____ @_2 :00 PM______, THIS TEST MUST BE DONE BEFORE SURGERY,  COVID TESTING SITE [place] [address], IT IS ON THE RIGHT GOING OUT [address] APPROXIMATELY  2 MINUTES PAST [employer] ON THE RIGHT. ONCE YOUR COVID TEST IS COMPLETED,  PLEASE BEGIN THE QUARANTINE INSTRUCTIONS AS OUTLINED IN YOUR HANDOUT.                THORNTON DOHRMANN    Your procedure is scheduled on: 02/29/20   Report to [facility] Main  Entrance   Report to admitting at  10:00 AM     Call this number if you have problems the morning of surgery [phone]    Remember: Do not eat food or drink liquids :After Midnight.   BRUSH YOUR TEETH MORNING OF SURGERY AND RINSE YOUR MOUTH OUT, NO CHEWING GUM CANDY OR MINTS.     Take these medicines the morning of surgery with A SIP OF WATER: Amlodipine, Tamsulosin  DO NOT TAKE ANY DIABETIC MEDICATIONS DAY OF YOUR SURGERY   How to Manage Your Diabetes Before and After Surgery  Why is it important to control my blood sugar before and after surgery? . Improving blood sugar levels before and after surgery helps healing and can limit problems. . A way of improving blood sugar control is eating a healthy diet by: o  Eating less sugar and carbohydrates o  Increasing activity/exercise o  Talking with your doctor about reaching your blood sugar goals . High blood sugars (greater than 180 mg/dL) can raise your risk of infections and slow your recovery, so you will need to focus on controlling your diabetes during the weeks before surgery. . Make sure that the doctor who takes care of your diabetes knows about your planned surgery including the date and location.  How  do I manage my blood sugar before surgery? . Check your blood sugar at least 4 times a day, starting 2 days before surgery, to make sure that the level is not too high or low. o Check your blood sugar the morning of your surgery when you wake up and every 2 hours until you get to the Short Stay unit. . If your blood sugar is less than 70 mg/dL, you will need to treat for low blood sugar: o Do not take insulin. o Treat a low blood sugar (less than 70 mg/dL) with  cup of clear juice (cranberry or apple), 4 glucose tablets, OR glucose gel. o      Recheck blood sugar in 15 minutes after treatment (to make sure it is greater than 70 mg/dL). If your blood sugar is not greater than 70 mg/dL on recheck, call [phone] for further instructions. . Report your blood sugar to the short stay nurse when you get to Short Stay.  . If you are admitted to the hospital after surgery: o Your blood sugar will be checked by the staff and you will probably be given insulin after surgery (instead of oral diabetes medicines) to make sure you have good blood sugar levels. o The goal for blood sugar control after surgery is 80-180 mg/dL.   WHAT DO I DO ABOUT  MY DIABETES MEDICATION?  Marland Kitchen Do not take oral diabetes medicines (pills) the morning of surgery.                  You may not have any metal on your body including hair pins and              piercings  Do not wear jewelry,  lotions, powders or deodorant              Men may shave face and neck.   Do not bring valuables to the hospital. [place].  Contacts, dentures or bridgework may not be worn into surgery.  Leave suitcase in the car. After surgery it may be brought to your room.     Patients discharged the day of surgery will not be allowed to drive home.   IF YOU ARE HAVING SURGERY AND GOING HOME THE SAME DAY, YOU MUST HAVE AN ADULT TO DRIVE YOU HOME AND BE WITH YOU FOR 24 HOURS.   YOU MAY GO HOME BY  TAXI OR UBER OR ORTHERWISE, BUT AN ADULT MUST ACCOMPANY YOU HOME AND STAY WITH YOU FOR 24 HOURS.  Name and phone number of your driver:  Special Instructions: N/A              Please read over the following fact sheets you were given: _____________________________________________________________________             [facility] - Preparing for Surgery Before surgery, you can play an important role.   Because skin is not sterile, your skin needs to be as free of germs as possible.   You can reduce the number of germs on your skin by washing with CHG (chlorahexidine gluconate) soap before surgery.  CHG is an antiseptic cleaner which kills germs and bonds with the skin to continue killing germs even after washing. Please DO NOT use if you have an allergy to CHG or antibacterial soaps.   If your skin becomes reddened/irritated stop using the CHG and inform your nurse when you arrive at Short Stay.  You may shave your face/neck.  Please follow these instructions carefully:  1.  Shower with CHG Soap the night before surgery and the  morning of Surgery.  2.  If you choose to wash your hair, wash your hair first as usual with your  normal  shampoo.  3.  After you shampoo, rinse your hair and body thoroughly to remove the  shampoo.                                        4.  Use CHG as you would any other liquid soap.  You can apply chg directly  to the skin and wash                       Gently with a scrungie or clean washcloth.  5.  Apply the CHG Soap to your body ONLY FROM THE NECK DOWN.   Do not use on face/ open                           Wound or open sores. Avoid contact with eyes, ears mouth and genitals (private parts).  Wash face,  Genitals (private parts) with your normal soap.             6.  Wash thoroughly, paying special attention to the area where your surgery  will be performed.  7.  Thoroughly rinse your body with warm water from the neck down.  8.  DO NOT  shower/wash with your normal soap after using and rinsing off  the CHG Soap.             9.  Pat yourself dry with a clean towel.            10.  Wear clean pajamas.            11.  Place clean sheets on your bed the night of your first shower and do not  sleep with pets. Day of Surgery : Do not apply any lotions/deodorants the morning of surgery.  Please wear clean clothes to the hospital/surgery center.  FAILURE TO FOLLOW THESE INSTRUCTIONS MAY RESULT IN THE CANCELLATION OF YOUR SURGERY PATIENT SIGNATURE_________________________________  NURSE SIGNATURE__________________________________  ________________________________________________________________________

## 2020-02-18 ENCOUNTER — Encounter (INDEPENDENT_AMBULATORY_CARE_PROVIDER_SITE_OTHER): Payer: Self-pay

## 2020-02-18 ENCOUNTER — Other Ambulatory Visit: Payer: Self-pay

## 2020-02-18 ENCOUNTER — Encounter (HOSPITAL_COMMUNITY)
Admission: RE | Admit: 2020-02-18 | Discharge: 2020-02-18 | Disposition: A | Discharge status: Home / Self Care | Payer: Medicare Other | Source: Ambulatory Visit | Attending: Urology | Admitting: Urology

## 2020-02-18 ENCOUNTER — Encounter (HOSPITAL_COMMUNITY): Payer: Self-pay

## 2020-02-18 DIAGNOSIS — M419 Scoliosis, unspecified: Secondary | ICD-10-CM | POA: Insufficient documentation

## 2020-02-18 DIAGNOSIS — N210 Calculus in bladder: Secondary | ICD-10-CM | POA: Diagnosis not present

## 2020-02-18 DIAGNOSIS — I10 Essential (primary) hypertension: Secondary | ICD-10-CM | POA: Diagnosis not present

## 2020-02-18 DIAGNOSIS — E119 Type 2 diabetes mellitus without complications: Secondary | ICD-10-CM | POA: Insufficient documentation

## 2020-02-18 DIAGNOSIS — Z7984 Long term (current) use of oral hypoglycemic drugs: Secondary | ICD-10-CM | POA: Diagnosis not present

## 2020-02-18 DIAGNOSIS — E669 Obesity, unspecified: Secondary | ICD-10-CM | POA: Diagnosis not present

## 2020-02-18 DIAGNOSIS — Z79899 Other long term (current) drug therapy: Secondary | ICD-10-CM | POA: Diagnosis not present

## 2020-02-18 DIAGNOSIS — Z01818 Encounter for other preprocedural examination: Secondary | ICD-10-CM | POA: Insufficient documentation

## 2020-02-18 DIAGNOSIS — N400 Enlarged prostate without lower urinary tract symptoms: Secondary | ICD-10-CM | POA: Insufficient documentation

## 2020-02-18 HISTORY — DX: Personal history of urinary calculi: Z87.442

## 2020-02-18 HISTORY — DX: Malignant (primary) neoplasm, unspecified: C80.1

## 2020-02-18 LAB — CBC
HCT: 40 % (ref 39.0–52.0)
Hemoglobin: 13.2 g/dL (ref 13.0–17.0)
MCH: 30.3 pg (ref 26.0–34.0)
MCHC: 33 g/dL (ref 30.0–36.0)
MCV: 92 fL (ref 80.0–100.0)
Platelets: 331 10*3/uL (ref 150–400)
RBC: 4.35 MIL/uL (ref 4.22–5.81)
RDW: 13 % (ref 11.5–15.5)
WBC: 10.3 10*3/uL (ref 4.0–10.5)
nRBC: 0 % (ref 0.0–0.2)

## 2020-02-18 LAB — BASIC METABOLIC PANEL
Anion gap: 15 (ref 5–15)
BUN: 10 mg/dL (ref 8–23)
CO2: 30 mmol/L (ref 22–32)
Calcium: 10.3 mg/dL (ref 8.9–10.3)
Chloride: 90 mmol/L — ABNORMAL LOW (ref 98–111)
Creatinine, Ser: 0.87 mg/dL (ref 0.61–1.24)
GFR, Estimated: >60 mL/min (ref >60)
Glucose, Bld: 163 mg/dL — ABNORMAL HIGH (ref 70–99)
Potassium: 3.2 mmol/L — ABNORMAL LOW (ref 3.5–5.1)
Sodium: 135 mmol/L (ref 135–145)

## 2020-02-18 LAB — GLUCOSE, CAPILLARY: Glucose-Capillary: 147 mg/dL — ABNORMAL HIGH (ref 70–99)

## 2020-02-18 NOTE — Progress Notes (Signed)
COVID Vaccine Completed:yes Date COVID Vaccine completed:08/14/19 COVID vaccine manufacturer: [place]     PCP - Dr. Roselle Locus Cardiologist - none  Chest x-ray - no EKG - 02/18/20 Stress Test - no ECHO - no Cardiac Cath - no Pacemaker/ICD device last checked:NA  Sleep Study -no  CPAP -   Fasting Blood Sugar - doesn't test and denies having DM but takes metformin Checks Blood Sugar _____ times a day  Blood Thinner Instructions:NA Aspirin Instructions: Last Dose:  Anesthesia review:   Patient denies shortness of breath, fever, cough and chest pain at PAT appointment yes   Patient verbalized understanding of instructions that were given to them at the PAT appointment. Patient was also instructed that they will need to review over the PAT instructions again at home before surgery. Yes  Pt is able to climb his stairs at home without SOB. He has pain in his knee and has a sedentary life style. He uses a cane around the house.

## 2020-02-22 NOTE — Progress Notes (Signed)
Anesthesia Chart Review:   Case: [id number] Date/Time: 02/29/20 1145   Procedures:      Holmium Laser Enucleation of the Prostate with Morcellation (N/A ) - ONLY NEEDS 120 MIN FOR ALL PROCEDURES     CYSTOSCOPY WITH LITHOLAPAXY (N/A )   Anesthesia type: General   Pre-op diagnosis: BENIGN PROSTATIC HYPERPLASIA, BLADDER STONE   Location: [facility] PROCEDURE ROOM / [facility]   Surgeons: Janith Lima, MD      DISCUSSION:  Pt is a 74 year old with hx HTN, DM, scoliosis  Pt did not report concerning CV symptoms at pre-surgical testing appointment. Last office visit note with PCP 01/29/20 pt denied active CV symptoms  Reviewed case/EKG with Dr. Fransisco Beau.    VS: There were no vitals taken for this visit.   PROVIDERS: - PCP is Merrilee Seashore, MD who is aware of upcoming procedure   LABS: Labs reviewed: Acceptable for surgery. (all labs ordered are listed, but only abnormal results are displayed)  Labs Reviewed  BASIC METABOLIC PANEL - Abnormal; Notable for the following components:      Result Value   Potassium 3.2 (*)    Chloride 90 (*)    Glucose, Bld 163 (*)    All other components within normal limits  GLUCOSE, CAPILLARY - Abnormal; Notable for the following components:   Glucose-Capillary 147 (*)    All other components within normal limits  CBC     EKG 02/18/20: NSR. Left axis deviation. Inferior infarct, age undetermined. Poor R wave progression - No ekg for comparison is available.    CV: None  Past Medical History:  Diagnosis Date  . Cancer ([place])    skin   . Diabetes mellitus without complication ([place])   . History of kidney stones   . Hypertension   . Nephrolithiasis   . Obesity (BMI 30-39.9)   . Scoliosis     Past Surgical History:  Procedure Laterality Date  . HERNIA REPAIR Right 1968   groin  . LITHOTRIPSY    . TONSILLECTOMY      MEDICATIONS: . acetaminophen (TYLENOL) 500 MG tablet  . amLODipine (NORVASC) 5 MG tablet  . atenolol-chlorthalidone  (TENORETIC) 50-25 MG tablet  . diphenhydramine-acetaminophen (TYLENOL PM) 25-500 MG TABS tablet  . finasteride (PROSCAR) 5 MG tablet  . GARLIC PO  . Menthol-Methyl Salicylate (MUSCLE RUB) 10-15 % CREA  . metFORMIN (GLUCOPHAGE-XR) 500 MG 24 hr tablet  . potassium chloride SA (KLOR-CON) 20 MEQ tablet  . tamsulosin (FLOMAX) 0.4 MG CAPS capsule   No current facility-administered medications for this encounter.    If no changes, I anticipate pt can proceed with surgery as scheduled.   Willeen Cass, PhD, FNP-BC [facility] Short Stay Surgical Center/Anesthesiology Phone: [phone] 02/22/2020 11:08 AM

## 2020-02-22 NOTE — Anesthesia Preprocedure Evaluation (Addendum)
Anesthesia Evaluation  Patient identified by MRN, date of birth, ID band Patient awake    Reviewed: Allergy & Precautions, NPO status , Patient's Chart, lab work & pertinent test results  Airway Mallampati: II  TM Distance: >3 FB Neck ROM: Full    Dental  (+) Chipped, Poor Dentition, Dental Advisory Given,    Pulmonary neg pulmonary ROS,    Pulmonary exam normal breath sounds clear to auscultation       Cardiovascular hypertension, Pt. on medications negative cardio ROS Normal cardiovascular exam Rhythm:Regular Rate:Normal     Neuro/Psych negative neurological ROS  negative psych ROS   GI/Hepatic negative GI ROS, Neg liver ROS,   Endo/Other  negative endocrine ROSdiabetes, Type 2, Oral Hypoglycemic Agents  Renal/GU negative Renal ROS  negative genitourinary   Musculoskeletal negative musculoskeletal ROS (+)   Abdominal   Peds  Hematology negative hematology ROS (+)   Anesthesia Other Findings   Reproductive/Obstetrics                           Anesthesia Physical Anesthesia Plan  ASA: II  Anesthesia Plan: General   Post-op Pain Management:    Induction: Intravenous  PONV Risk Score and Plan: 2 and Ondansetron and Dexamethasone  Airway Management Planned: LMA  Additional Equipment:   Intra-op Plan:   Post-operative Plan: Extubation in OR  Informed Consent: I have reviewed the patients History and Physical, chart, labs and discussed the procedure including the risks, benefits and alternatives for the proposed anesthesia with the patient or authorized representative who has indicated his/her understanding and acceptance.     Dental advisory given  Plan Discussed with: CRNA  Anesthesia Plan Comments:        Anesthesia Quick Evaluation

## 2020-02-26 ENCOUNTER — Other Ambulatory Visit (HOSPITAL_COMMUNITY)
Admission: RE | Admit: 2020-02-26 | Discharge: 2020-02-26 | Disposition: A | Discharge status: Home / Self Care | Payer: Medicare Other | Source: Ambulatory Visit | Attending: Urology | Admitting: Urology

## 2020-02-26 DIAGNOSIS — Z01812 Encounter for preprocedural laboratory examination: Secondary | ICD-10-CM | POA: Diagnosis not present

## 2020-02-26 DIAGNOSIS — Z20822 Contact with and (suspected) exposure to covid-19: Secondary | ICD-10-CM | POA: Insufficient documentation

## 2020-02-26 LAB — SARS CORONAVIRUS 2 (TAT 6-24 HRS): SARS Coronavirus 2: NEGATIVE

## 2020-02-29 ENCOUNTER — Encounter (HOSPITAL_COMMUNITY)
Admission: RE | Disposition: A | Discharge status: Home / Self Care | Payer: Self-pay | Source: Home / Self Care | Attending: Urology

## 2020-02-29 ENCOUNTER — Ambulatory Visit (HOSPITAL_COMMUNITY): Payer: Medicare Other | Admitting: Certified Registered Nurse Anesthetist

## 2020-02-29 ENCOUNTER — Ambulatory Visit (HOSPITAL_COMMUNITY)
Admission: RE | Admit: 2020-02-29 | Discharge: 2020-03-01 | Disposition: A | Discharge status: Home / Self Care | Payer: Medicare Other | Attending: Urology | Admitting: Urology

## 2020-02-29 ENCOUNTER — Ambulatory Visit (HOSPITAL_COMMUNITY): Payer: Medicare Other | Admitting: Physician Assistant

## 2020-02-29 ENCOUNTER — Encounter (HOSPITAL_COMMUNITY): Payer: Self-pay | Admitting: Urology

## 2020-02-29 ENCOUNTER — Other Ambulatory Visit: Payer: Self-pay

## 2020-02-29 ENCOUNTER — Ambulatory Visit (HOSPITAL_COMMUNITY): Payer: Medicare Other

## 2020-02-29 DIAGNOSIS — Z6839 Body mass index (BMI) 39.0-39.9, adult: Secondary | ICD-10-CM | POA: Insufficient documentation

## 2020-02-29 DIAGNOSIS — I10 Essential (primary) hypertension: Secondary | ICD-10-CM | POA: Insufficient documentation

## 2020-02-29 DIAGNOSIS — N138 Other obstructive and reflux uropathy: Secondary | ICD-10-CM | POA: Insufficient documentation

## 2020-02-29 DIAGNOSIS — Z7984 Long term (current) use of oral hypoglycemic drugs: Secondary | ICD-10-CM | POA: Diagnosis not present

## 2020-02-29 DIAGNOSIS — Z79899 Other long term (current) drug therapy: Secondary | ICD-10-CM | POA: Insufficient documentation

## 2020-02-29 DIAGNOSIS — E669 Obesity, unspecified: Secondary | ICD-10-CM | POA: Insufficient documentation

## 2020-02-29 DIAGNOSIS — N200 Calculus of kidney: Secondary | ICD-10-CM | POA: Insufficient documentation

## 2020-02-29 DIAGNOSIS — N401 Enlarged prostate with lower urinary tract symptoms: Secondary | ICD-10-CM | POA: Diagnosis not present

## 2020-02-29 DIAGNOSIS — R338 Other retention of urine: Secondary | ICD-10-CM | POA: Insufficient documentation

## 2020-02-29 DIAGNOSIS — J90 Pleural effusion, not elsewhere classified: Secondary | ICD-10-CM | POA: Diagnosis not present

## 2020-02-29 DIAGNOSIS — N400 Enlarged prostate without lower urinary tract symptoms: Secondary | ICD-10-CM | POA: Diagnosis present

## 2020-02-29 DIAGNOSIS — E119 Type 2 diabetes mellitus without complications: Secondary | ICD-10-CM | POA: Diagnosis not present

## 2020-02-29 DIAGNOSIS — K8020 Calculus of gallbladder without cholecystitis without obstruction: Secondary | ICD-10-CM | POA: Insufficient documentation

## 2020-02-29 DIAGNOSIS — N320 Bladder-neck obstruction: Secondary | ICD-10-CM | POA: Insufficient documentation

## 2020-02-29 DIAGNOSIS — N210 Calculus in bladder: Secondary | ICD-10-CM | POA: Diagnosis not present

## 2020-02-29 HISTORY — PX: CYSTOSCOPY WITH LITHOLAPAXY: SHX1425

## 2020-02-29 LAB — CBC
HCT: 36.7 % — ABNORMAL LOW (ref 39.0–52.0)
Hemoglobin: 12.3 g/dL — ABNORMAL LOW (ref 13.0–17.0)
MCH: 30.8 pg (ref 26.0–34.0)
MCHC: 33.5 g/dL (ref 30.0–36.0)
MCV: 91.8 fL (ref 80.0–100.0)
Platelets: 329 10*3/uL (ref 150–400)
RBC: 4 MIL/uL — ABNORMAL LOW (ref 4.22–5.81)
RDW: 13.1 % (ref 11.5–15.5)
WBC: 13.1 10*3/uL — ABNORMAL HIGH (ref 4.0–10.5)
nRBC: 0 % (ref 0.0–0.2)

## 2020-02-29 LAB — GLUCOSE, CAPILLARY
Glucose-Capillary: 162 mg/dL — ABNORMAL HIGH (ref 70–99)
Glucose-Capillary: 173 mg/dL — ABNORMAL HIGH (ref 70–99)
Glucose-Capillary: 207 mg/dL — ABNORMAL HIGH (ref 70–99)
Glucose-Capillary: 228 mg/dL — ABNORMAL HIGH (ref 70–99)

## 2020-02-29 LAB — CREATININE, SERUM
Creatinine, Ser: 1.06 mg/dL (ref 0.61–1.24)
GFR, Estimated: >60 mL/min (ref >60)

## 2020-02-29 SURGERY — Holmium Laser Enucleation of the Prostate with Morcellation
Anesthesia: General

## 2020-02-29 MED ORDER — ROCURONIUM BROMIDE 100 MG/10ML IV SOLN
INTRAVENOUS | Status: DC | PRN
  Administered 2020-02-29: 20 mg via INTRAVENOUS
  Administered 2020-02-29: 10 mg via INTRAVENOUS
  Administered 2020-02-29: 20 mg via INTRAVENOUS
  Administered 2020-02-29: 70 mg via INTRAVENOUS

## 2020-02-29 MED ORDER — CHLORHEXIDINE GLUCONATE 0.12 % MT SOLN
15.0000 mL | Freq: Once | OROMUCOSAL | Status: AC
  Administered 2020-02-29: 15 mL via OROMUCOSAL

## 2020-02-29 MED ORDER — CEPHALEXIN 500 MG PO CAPS: 500.0000 mg | ORAL_CAPSULE | Freq: Two times a day (BID) | ORAL | 0 refills | Status: AC

## 2020-02-29 MED ORDER — FENTANYL CITRATE (PF) 100 MCG/2ML IJ SOLN
INTRAMUSCULAR | Status: AC
  Filled 2020-02-29: qty 2

## 2020-02-29 MED ORDER — CHLORTHALIDONE 25 MG PO TABS
25.0000 mg | ORAL_TABLET | Freq: Every day | ORAL | Status: DC
  Administered 2020-03-01: 25 mg via ORAL
  Filled 2020-02-29: qty 1

## 2020-02-29 MED ORDER — INSULIN ASPART 100 UNIT/ML ~~LOC~~ SOLN
0.0000 [IU] | Freq: Three times a day (TID) | SUBCUTANEOUS | Status: DC
  Administered 2020-02-29: 5 [IU] via SUBCUTANEOUS
  Administered 2020-03-01 (×2): 3 [IU] via SUBCUTANEOUS

## 2020-02-29 MED ORDER — ATENOLOL 50 MG PO TABS
50.0000 mg | ORAL_TABLET | Freq: Every day | ORAL | Status: DC
  Administered 2020-03-01: 50 mg via ORAL
  Filled 2020-02-29: qty 1

## 2020-02-29 MED ORDER — SODIUM CHLORIDE 0.9% FLUSH: 3.0000 mL | INTRAVENOUS | Status: DC | PRN

## 2020-02-29 MED ORDER — SODIUM CHLORIDE 0.9 % IV SOLN: 250.0000 mL | INTRAVENOUS | Status: DC | PRN

## 2020-02-29 MED ORDER — AMLODIPINE BESYLATE 5 MG PO TABS
5.0000 mg | ORAL_TABLET | Freq: Every day | ORAL | Status: DC
  Administered 2020-03-01: 5 mg via ORAL
  Filled 2020-02-29: qty 1

## 2020-02-29 MED ORDER — DIPHENHYDRAMINE HCL 12.5 MG/5ML PO ELIX: 12.5000 mg | ORAL_SOLUTION | Freq: Four times a day (QID) | ORAL | Status: DC | PRN

## 2020-02-29 MED ORDER — DEXAMETHASONE SODIUM PHOSPHATE 4 MG/ML IJ SOLN
INTRAMUSCULAR | Status: DC | PRN
  Administered 2020-02-29: 5 mg via INTRAVENOUS

## 2020-02-29 MED ORDER — SUGAMMADEX SODIUM 200 MG/2ML IV SOLN
INTRAVENOUS | Status: DC | PRN
  Administered 2020-02-29: 200 mg via INTRAVENOUS

## 2020-02-29 MED ORDER — OXYBUTYNIN CHLORIDE ER 5 MG PO TB24
10.0000 mg | ORAL_TABLET | Freq: Every day | ORAL | Status: DC
  Administered 2020-03-01: 10 mg via ORAL
  Filled 2020-02-29: qty 2

## 2020-02-29 MED ORDER — SODIUM CHLORIDE 0.9 % IR SOLN
Status: DC | PRN
  Administered 2020-02-29: 63000 mL

## 2020-02-29 MED ORDER — DOCUSATE SODIUM 100 MG PO CAPS: 100.0000 mg | ORAL_CAPSULE | Freq: Every day | ORAL | 2 refills | Status: AC | PRN

## 2020-02-29 MED ORDER — SODIUM CHLORIDE 0.9 % IV SOLN: INTRAVENOUS | Status: DC

## 2020-02-29 MED ORDER — DIPHENHYDRAMINE HCL 50 MG/ML IJ SOLN: 12.5000 mg | Freq: Four times a day (QID) | INTRAMUSCULAR | Status: DC | PRN

## 2020-02-29 MED ORDER — DOCUSATE SODIUM 100 MG PO CAPS
100.0000 mg | ORAL_CAPSULE | Freq: Two times a day (BID) | ORAL | Status: DC
  Administered 2020-02-29 – 2020-03-01 (×2): 100 mg via ORAL
  Filled 2020-02-29 (×2): qty 1

## 2020-02-29 MED ORDER — PHENYLEPHRINE 40 MCG/ML (10ML) SYRINGE FOR IV PUSH (FOR BLOOD PRESSURE SUPPORT)
PREFILLED_SYRINGE | INTRAVENOUS | Status: DC | PRN
  Administered 2020-02-29 (×3): 80 ug via INTRAVENOUS

## 2020-02-29 MED ORDER — SODIUM CHLORIDE 0.9 % IR SOLN: 3000.0000 mL | Status: DC

## 2020-02-29 MED ORDER — 0.9 % SODIUM CHLORIDE (POUR BTL) OPTIME
TOPICAL | Status: DC | PRN
  Administered 2020-02-29: 1000 mL

## 2020-02-29 MED ORDER — ACETAMINOPHEN 500 MG PO TABS
ORAL_TABLET | ORAL | Status: AC
  Filled 2020-02-29: qty 2

## 2020-02-29 MED ORDER — MORPHINE SULFATE (PF) 2 MG/ML IV SOLN: 2.0000 mg | INTRAVENOUS | Status: DC | PRN

## 2020-02-29 MED ORDER — CEFAZOLIN SODIUM-DEXTROSE 2-4 GM/100ML-% IV SOLN
2.0000 g | Freq: Once | INTRAVENOUS | Status: AC
  Administered 2020-02-29: 2 g via INTRAVENOUS
  Filled 2020-02-29: qty 100

## 2020-02-29 MED ORDER — ONDANSETRON HCL 4 MG/2ML IJ SOLN
INTRAMUSCULAR | Status: AC
  Filled 2020-02-29: qty 2

## 2020-02-29 MED ORDER — DEXAMETHASONE SODIUM PHOSPHATE 10 MG/ML IJ SOLN
INTRAMUSCULAR | Status: AC
  Filled 2020-02-29: qty 1

## 2020-02-29 MED ORDER — OXYCODONE-ACETAMINOPHEN 5-325 MG PO TABS: 1.0000 | ORAL_TABLET | ORAL | Status: DC | PRN

## 2020-02-29 MED ORDER — SODIUM CHLORIDE 0.9% FLUSH
3.0000 mL | Freq: Two times a day (BID) | INTRAVENOUS | Status: DC
  Administered 2020-02-29 – 2020-03-01 (×2): 3 mL via INTRAVENOUS

## 2020-02-29 MED ORDER — FENTANYL CITRATE (PF) 100 MCG/2ML IJ SOLN: 25.0000 ug | INTRAMUSCULAR | Status: DC | PRN

## 2020-02-29 MED ORDER — OXYCODONE-ACETAMINOPHEN 5-325 MG PO TABS
1.0000 | ORAL_TABLET | ORAL | 0 refills | Status: AC | PRN
Start: 2020-02-29 — End: ?

## 2020-02-29 MED ORDER — ATENOLOL-CHLORTHALIDONE 50-25 MG PO TABS: 1.0000 | ORAL_TABLET | Freq: Every day | ORAL | Status: DC

## 2020-02-29 MED ORDER — ACETAMINOPHEN 325 MG PO TABS: 650.0000 mg | ORAL_TABLET | ORAL | Status: DC | PRN

## 2020-02-29 MED ORDER — LACTATED RINGERS IV SOLN: INTRAVENOUS | Status: DC

## 2020-02-29 MED ORDER — ACETAMINOPHEN 500 MG PO TABS
1000.0000 mg | ORAL_TABLET | Freq: Once | ORAL | Status: AC
  Administered 2020-02-29: 1000 mg via ORAL
  Filled 2020-02-29: qty 2

## 2020-02-29 MED ORDER — ONDANSETRON HCL 4 MG/2ML IJ SOLN
INTRAMUSCULAR | Status: DC | PRN
  Administered 2020-02-29: 4 mg via INTRAVENOUS

## 2020-02-29 MED ORDER — BISACODYL 10 MG RE SUPP: 10.0000 mg | Freq: Every day | RECTAL | Status: DC | PRN

## 2020-02-29 MED ORDER — FENTANYL CITRATE (PF) 250 MCG/5ML IJ SOLN
INTRAMUSCULAR | Status: AC
  Filled 2020-02-29: qty 5

## 2020-02-29 MED ORDER — LIDOCAINE 2% (20 MG/ML) 5 ML SYRINGE
INTRAMUSCULAR | Status: AC
  Filled 2020-02-29: qty 5

## 2020-02-29 MED ORDER — ROCURONIUM BROMIDE 10 MG/ML (PF) SYRINGE
PREFILLED_SYRINGE | INTRAVENOUS | Status: AC
  Filled 2020-02-29: qty 10

## 2020-02-29 MED ORDER — PROPOFOL 10 MG/ML IV BOLUS
INTRAVENOUS | Status: AC
  Filled 2020-02-29: qty 20

## 2020-02-29 MED ORDER — ORAL CARE MOUTH RINSE: 15.0000 mL | Freq: Once | OROMUCOSAL | Status: AC

## 2020-02-29 MED ORDER — POLYETHYLENE GLYCOL 3350 17 G PO PACK: 17.0000 g | PACK | Freq: Every day | ORAL | Status: DC | PRN

## 2020-02-29 MED ORDER — PROPOFOL 10 MG/ML IV BOLUS
INTRAVENOUS | Status: DC | PRN
  Administered 2020-02-29: 200 mg via INTRAVENOUS

## 2020-02-29 MED ORDER — POTASSIUM CHLORIDE CRYS ER 20 MEQ PO TBCR
40.0000 meq | EXTENDED_RELEASE_TABLET | Freq: Two times a day (BID) | ORAL | Status: DC
  Administered 2020-02-29 – 2020-03-01 (×2): 40 meq via ORAL
  Filled 2020-02-29 (×2): qty 2

## 2020-02-29 MED ORDER — FENTANYL CITRATE (PF) 100 MCG/2ML IJ SOLN
INTRAMUSCULAR | Status: DC | PRN
  Administered 2020-02-29 (×6): 50 ug via INTRAVENOUS

## 2020-02-29 MED ORDER — LIDOCAINE 2% (20 MG/ML) 5 ML SYRINGE
INTRAMUSCULAR | Status: DC | PRN
  Administered 2020-02-29: 60 mg via INTRAVENOUS

## 2020-02-29 MED ORDER — HEPARIN SODIUM (PORCINE) 5000 UNIT/ML IJ SOLN
5000.0000 [IU] | Freq: Three times a day (TID) | INTRAMUSCULAR | Status: DC
  Administered 2020-02-29 – 2020-03-01 (×2): 5000 [IU] via SUBCUTANEOUS
  Filled 2020-02-29 (×2): qty 1

## 2020-02-29 SURGICAL SUPPLY — 21 items
BAG URINE DRAIN 2000ML AR STRL (UROLOGICAL SUPPLIES) ×3 IMPLANT
BAG URO CATCHER STRL LF (MISCELLANEOUS) ×3 IMPLANT
CATH FOLEY 3WAY 30CC 22FR (CATHETERS) ×3 IMPLANT
CATH URET 5FR 28IN OPEN ENDED (CATHETERS) ×6 IMPLANT
CLOTH BEACON ORANGE TIMEOUT ST (SAFETY) ×3 IMPLANT
COVER WAND RF STERILE (DRAPES) IMPLANT
ELECT BIVAP BIPO 22/24 DONUT (ELECTROSURGICAL) ×3
ELECTRD BIVAP BIPO 22/24 DONUT (ELECTROSURGICAL) ×1 IMPLANT
GOWN STRL REUS W/TWL LRG LVL3 (GOWN DISPOSABLE) ×6 IMPLANT
GUIDEWIRE STR DUAL SENSOR (WIRE) ×3 IMPLANT
KIT TURNOVER KIT A (KITS) IMPLANT
LASER FIB FLEXIVA PULSE ID 550 (Laser) ×3 IMPLANT
LASER FIB FLEXIVA PULSE ID 910 (Laser) IMPLANT
MANIFOLD NEPTUNE II (INSTRUMENTS) ×3 IMPLANT
PACK CYSTO (CUSTOM PROCEDURE TRAY) ×3 IMPLANT
STENT URET 6FRX26 CONTOUR (STENTS) IMPLANT
SYR TOOMEY IRRIG 70ML (MISCELLANEOUS) ×3
SYRINGE TOOMEY IRRIG 70ML (MISCELLANEOUS) ×1 IMPLANT
TUBING CONNECTING 10 (TUBING) ×2 IMPLANT
TUBING CONNECTING 10' (TUBING) ×1
TUBING UROLOGY SET (TUBING) ×3 IMPLANT

## 2020-02-29 NOTE — H&P (Addendum)
Office Visit Report     01/22/2020   --------------------------------------------------------------------------------   Carlos Alvarez  MRN: [id number]  DOB: 04-10-1946, 74 year old Male  SSN:    PRIMARY CARE:    REFERRING:    PROVIDER:  Rexene Alberts, M.D.  LOCATION:  [facility] [phone]     --------------------------------------------------------------------------------   CC/HPI: Carlos Alvarez is a 74 year old male seen as a hospital follow-up with urinary retention, bilateral hydronephrosis, bladder outlet obstruction, gross hematuria and renal failure.   He was admitted on 01/03/2020 after suffering a fall. He was found to be in renal failure with a creatinine of 5 from an unknown baseline. CT abdomen pelvis on 01/06/2020 demonstrated resolved hydronephrosis with a large nonobstructing left renal calculus, marked circumferential bladder wall thickening and a bladder collapsed around a Foley catheter. I measured his prostate to be 7.2 x 6.0 x 5.4 cm equating to a roughly 120 g prostate. He was discharged home with Foley catheter in place.   Patient states he has been doing well and tolerating Foley catheter. Most recent creatinine is 1.3 on 01/06/2020.   Patient currently denies fever, chills, sweats, nausea, vomiting, abdominal or flank pain, gross hematuria or dysuria. He states that the catheter has been bothering him some and it appears that he already has a small ventral erosion.   He denies significant cardiac or pulmonary history. He denies taking anticoagulation. He has an ECOG 3.     ALLERGIES: None   MEDICATIONS: None   GU PSH: None   NON-GU PSH: Hernia Repair - 1965     GU PMH: None     PMH Notes: Scoliosis    NON-GU PMH: Arthritis    FAMILY HISTORY: 2 daughters - Runs in Family   SOCIAL HISTORY: Marital Status: Married Ethnicity: Not Hispanic Or Latino; Race: White Current Smoking Status: Patient has never smoked.   Tobacco Use Assessment  Completed: Used Tobacco in last 30 days? Has never drank.  Drinks 3 caffeinated drinks per day.    REVIEW OF SYSTEMS:    GU Review Male:   Patient reports frequent urination, get up at night to urinate, and leakage of urine. Patient denies hard to postpone urination, burning/ pain with urination, stream starts and stops, trouble starting your stream, have to strain to urinate , erection problems, and penile pain.  Gastrointestinal (Upper):   Patient reports vomiting. Patient denies nausea and indigestion/ heartburn.  Gastrointestinal (Lower):   Patient denies diarrhea and constipation.  Constitutional:   Patient denies fever, night sweats, weight loss, and fatigue.  Skin:   Patient reports itching. Patient denies skin rash/ lesion.  Eyes:   Patient denies blurred vision and double vision.  Ears/ Nose/ Throat:   Patient denies sore throat and sinus problems.  Hematologic/Lymphatic:   Patient denies swollen glands and easy bruising.  Cardiovascular:   Patient denies leg swelling and chest pains.  Respiratory:   Patient denies cough and shortness of breath.  Endocrine:   Patient denies excessive thirst.  Musculoskeletal:   Patient denies back pain and joint pain.  Neurological:   Patient denies headaches and dizziness.  Psychologic:   Patient denies depression and anxiety.   VITAL SIGNS: None   GU PHYSICAL EXAMINATION:    Anus and Perineum: No hemorrhoids. No anal stenosis. No rectal fissure, no anal fissure. No edema, no dimple, no perineal tenderness, no anal tenderness.  Scrotum: No lesions. No edema. No cysts. No warts.  Testes: Bilateral testicles nontender.   Urethral  Meatus: Foley catheter removed. Small ventral erosion noted.   Penis: Circumcised  Prostate: At least an 80 g prostate, a nodular   Seminal Vesicles: Nonpalpable.  Sphincter Tone: Normal sphincter. No rectal tenderness. No rectal mass.    MULTI-SYSTEM PHYSICAL EXAMINATION:    Respiratory: No labored breathing, no  use of accessory muscles.   Cardiovascular: Normal temperature, normal extremity pulses, no swelling, no varicosities.  Gastrointestinal: No mass, no tenderness, no rigidity, non obese abdomen.     Complexity of Data:  Source Of History:  Patient, Medical Record Summary  Lab Test Review:   PSA  Records Review:   AUA Symptom Score, Previous Doctor Records, Previous Hospital Records  Urine Test Review:   Urinalysis  Urodynamics Review:   Review Bladder Scan  X-Ray Review: C.T. Abdomen/Pelvis: Reviewed Films. Reviewed Report.    Notes:                     CLINICAL DATA: Follow-up hydronephrosis     EXAM:  CT ABDOMEN AND PELVIS WITHOUT CONTRAST     TECHNIQUE:  Multidetector CT imaging of the abdomen and pelvis was performed  following the standard protocol without IV contrast.     COMPARISON: CT L-spine 01/03/2020.     FINDINGS:  Lower chest: Small bilateral pleural effusions.     Hepatobiliary: Large gallstones noted without evidence of  cholecystitis. No focal lesion on noncontrast exam.     Pancreas: Pancreas is normal. No ductal dilatation. No pancreatic  inflammation.     Spleen: Normal spleen     Adrenals/urinary tract: Adrenal glands are normal. Large calculus in  lower pole of the LEFT kidney measures 10 mm. No hydronephrosis. No  hydroureter.     The bladder is decompressed around a Foley catheter. The bladder  demonstrates diffuse wall thickening up to 20 mm. Trabeculation of  the bladder wall. There is high-density material layering within the  bladder.     Prostate gland is enlarged two 70 mm     Stomach/Bowel: Stomach, small-bowel and cecum are normal. The  appendix is not identified but there is no pericecal inflammation to  suggest appendicitis. The colon and rectosigmoid colon are normal.     Vascular/Lymphatic: Abdominal aorta is normal caliber with  atherosclerotic calcification. There is no retroperitoneal or  periportal lymphadenopathy. No pelvic  lymphadenopathy.     Reproductive: Enlarged prostate gland measures 6.9 by 5.9 by 5.9 cm  (volume = 130 cm^3). Foley catheter in the bladder lumen     Other: No free fluid.     Musculoskeletal: No aggressive osseous lesion.     IMPRESSION:  1. No hydronephrosis.  2. Large nonobstructing LEFT renal calculus.  3. Marked circumferential bladder wall thickening with  trabeculation. Bladder collapsed around Foley catheter.  High-density material dependent within the bladder.  4. Prostate hypertrophy.  5. Small bilateral pleural effusions.  6. Cholelithiasis without evidence cholecystitis.  7. Aortic Atherosclerosis (ICD10-I70.0).        Electronically Signed  By: Suzy Bouchard M.D.  On: 01/06/2020 10:30   PROCEDURES:         Flexible Cystoscopy - 52000  Risks, benefits, and some of the potential complications of the procedure were discussed at length with the patient including infection, bleeding, voiding discomfort, urinary retention, fever, chills, sepsis, and others. All questions were answered. Informed consent was obtained. Antibiotic prophylaxis was given. Sterile technique and intraurethral analgesia were used.  Meatus:  Normal size. Normal location. Normal condition. Small ventral erosion  present from prior catheter.  Urethra:  No strictures.  External Sphincter:  Normal.  Verumontanum:  Normal.  Prostate:  Signigicant bilobar obstruction. Prostate at least 120g.  Bladder Neck:  Non-obstructing.  Ureteral Orifices:  Normal location. Normal size. Normal shape. Effluxed clear urine.  Bladder:  4+ trabeculation. Multiple small stones present throughout. Irritation identified from foley placement. No tumors identified.      The lower urinary tract was carefully examined. The procedure was well-tolerated and without complications. Antibiotic instructions were given. Instructions were given to call the office immediately for bloody urine, difficulty urinating, urinary retention,  painful or frequent urination, fever, chills, nausea, vomiting or other illness. The patient stated that he understood these instructions and would comply with them.        Voiding Trial - 51700  Instilled Volume: 400 cc  Voided Volume: 0 cc        Simple Foley Catheterization - 51702  Pt was prepped and cleaned. A 16 French Foley catheter was inserted into the bladder using sterile technique. A bedside bag was connected. Pt tolerated the procedure well.    ASSESSMENT:      ICD-10 Details  1 GU:   Urinary Retention - R33.8   2   BPH w/LUTS - N40.1   3   Bladder Stone - N21.0   4   Renal calculus - N20.0    PLAN:            Medications New Meds: Finasteride 5 mg tablet 1 tablet PO Daily   #90  3 Refill(s)  Tamsulosin Hcl 0.4 mg capsule 1 capsule PO Q HS   #90  3 Refill(s)            Orders Labs PSA, CULTURE, URINE          Document Letter(s):  Created for Patient: Clinical Summary         Notes:   1. Urinary retention: Foley catheter placed 01/03/2020 after found to be in acute renal failure from bladder outlet obstruction.   2. BPH with LUTS: Prostate size approximate 120 g on recent CT abdomen pelvis 01/06/2020. Cystoscopy today 01/14/2020 with significant obstructing by lobar hypertrophy. Several small stones identified within the bladder. Check PSA today. Discussed options for management of BPH. Continue Flomax and finasteride. Refill today. Discussed that he will likely need surgical procedure. Discussed holmium laser enucleation of the prostate, transurethral resection of the prostate in photo selective vaporization of the prostate. We discussed the risks and benefits of these procedures as well as anesthesia risks. Will schedule for HoLEP, cystolitholapaxy. Surgery letter sent.   4. Bladder stones: Multiple small bladder stones. Recommend removal with concomitant BOO procedure.   4. Left renal stone: Measures 1 cm and left lower pole on CT abdomen pelvis 01/06/2020. Pt is  asymptomatic. Recommend treating BOO from BPH prior to intervention for left renal stone. Discussed options of PCNL, ESWL and URS/LL.    Signed by Rexene Alberts, M.D. on 01/22/20 at 12:12 PM (EDT)  Urology Preoperative H&P   Chief Complaint: BPH with LUTS  History of Present Illness: Carlos Alvarez is a 74 y.o. male with BPH with LUTS here for HoLEP. Denies fevers or chills. Tolerating foley.    Past Medical History:  Diagnosis Date  . Cancer ([place])    skin   . Diabetes mellitus without complication ([place])   . History of kidney stones   . Hypertension   . Nephrolithiasis   . Obesity (BMI 30-39.9)   .  Scoliosis     Past Surgical History:  Procedure Laterality Date  . HERNIA REPAIR Right 1968   groin  . LITHOTRIPSY    . TONSILLECTOMY      Allergies: No Known Allergies  History reviewed. No pertinent family history.  Social History:  reports that he has never smoked. He has never used smokeless tobacco. He reports previous alcohol use. He reports previous drug use.  ROS: A complete review of systems was performed.  All systems are negative except for pertinent findings as noted.  Physical Exam:  Vital signs in last 24 hours: Temp:  [98.4 F (36.9 C)] 98.4 F (36.9 C) (11/05 1031) Pulse Rate:  [102] 102 (11/05 1031) Resp:  [18] 18 (11/05 1031) BP: (143)/(74) 143/74 (11/05 1031) SpO2:  [98 %] 98 % (11/05 1031) Constitutional:  Alert and oriented, No acute distress Cardiovascular: Regular rate and rhythm Respiratory: Normal respiratory effort, Lungs clear bilaterally GI: Abdomen is soft, nontender, nondistended, no abdominal masses GU: No CVA tenderness Lymphatic: No lymphadenopathy Neurologic: Grossly intact, no focal deficits Psychiatric: Normal mood and affect  Laboratory Data:  No results for input(s): WBC, HGB, HCT, PLT in the last 72 hours.  No results for input(s): NA, K, CL, GLUCOSE, BUN, CALCIUM, CREATININE in the last 72 hours.  Invalid input(s):  CO3   Results for orders placed or performed during the hospital encounter of 02/29/20 (from the past 24 hour(s))  Glucose, capillary     Status: Abnormal   Collection Time: 02/29/20 10:28 AM  Result Value Ref Range   Glucose-Capillary 162 (H) 70 - 99 mg/dL   Comment 1 Notify RN    Recent Results (from the past 240 hour(s))  SARS CORONAVIRUS 2 (TAT 6-24 HRS) Nasopharyngeal Nasopharyngeal Swab     Status: None   Collection Time: 02/26/20  2:07 PM   Specimen: Nasopharyngeal Swab  Result Value Ref Range Status   SARS Coronavirus 2 NEGATIVE NEGATIVE Final    Comment: (NOTE) SARS-CoV-2 target nucleic acids are NOT DETECTED.  The SARS-CoV-2 RNA is generally detectable in upper and lower respiratory specimens during the acute phase of infection. Negative results do not preclude SARS-CoV-2 infection, do not rule out co-infections with other pathogens, and should not be used as the sole basis for treatment or other patient management decisions. Negative results must be combined with clinical observations, patient history, and epidemiological information. The expected result is Negative.  Fact Sheet for Patients: SugarRoll.be  Fact Sheet for Healthcare Providers: https://www.woods-mathews.com/  This test is not yet approved or cleared by the [place] FDA and  has been authorized for detection and/or diagnosis of SARS-CoV-2 by FDA under an Emergency Use Authorization (EUA). This EUA will remain  in effect (meaning this test can be used) for the duration of the COVID-19 declaration under Se ction 564(b)(1) of the Act, 21 U.S.C. section 360bbb-3(b)(1), unless the authorization is terminated or revoked sooner.  Performed at [facility], [place] [address], [place], [place] [address]     Renal Function: No results for input(s): CREATININE in the last 168 hours. CrCl cannot be calculated (Unknown ideal weight.).  Radiologic  Imaging: No results found.  I independently reviewed the above imaging studies.  Assessment and Plan Carlos Alvarez is a 74 y.o. male with BPH with LUTS here for HoLEP, cystolitholapaxy.  Risks and benefits of Holmium Laser Enucleation of the Prostate were reviewed in detail including infection, bleeding, blood transfusion, injury to bladder/urethra/surrounding structures, erectile dysfunction, urinary incontinence, bladder neck  contracture, persistent obstructive and irritative voiding symptoms, and global anesthesia risks including but not limited to CVA, MI, DVT, PE, pneumonia, and death.  He expressed understanding and desire to proceed.   Matt R. Cataleah Stites MD 02/29/2020, 11:48 AM  [facility] Pager: [phone]

## 2020-02-29 NOTE — Discharge Instructions (Signed)
   Activity:  You are encouraged to ambulate frequently (about every hour during waking hours) to help prevent blood clots from forming in your legs or lungs.  However, you should not engage in any heavy lifting (> 10-15 lbs), strenuous activity, or straining.   Diet: You should advance your diet as instructed by your physician.  It will be normal to have some bloating, nausea, and abdominal discomfort intermittently.   Prescriptions:  You will be provided a prescription for pain medication to take as needed.  If your pain is not severe enough to require the prescription pain medication, you may take extra strength Tylenol instead which will have less side effects.  You should also take a prescribed stool softener to avoid straining with bowel movements as the prescription pain medication may constipate you.   What to call us about: You should call the office [phone]) if you develop fever > 101 or develop persistent vomiting. Activity:  You are encouraged to ambulate frequently (about every hour during waking hours) to help prevent blood clots from forming in your legs or lungs.  However, you should not engage in any heavy lifting (> 10-15 lbs), strenuous activity, or straining.  You will followup on Friday for foley removal.

## 2020-02-29 NOTE — Op Note (Signed)
Operative Note  Preoperative diagnosis:  1.  Bladder outlet obstruction. 2.  Benign prostatic hyperplasia. 3.  Urinary retention  Postoperative diagnosis: 1.  Bladder outlet obstruction. 2.  Benign prostatic hyperplasia. 3.  Urinary retention  Procedure(s): 1.  Cystoscopy 2.  Holmium laser enucleation of the prostate with tissue morcellation 3. Left retrograde pyelogram  Surgeon: Rexene Alberts, MD  Assistants:  None  Anesthesia:  General  Complications:  None  EBL:  Minimal  Specimens: 1. Prostate chips * No specimens in log *  Drains/Catheters: 1.  22-French 3-way Foley catheter  Intraoperative findings:   1.  No bladder lesions. 2.  Ureteral orifices in orthotopic position. 3.  Plus +2 trabeculation.  Indication:  Carlos Alvarez is a 74 y.o. male with a history of bladder outlet obstruction and benign prostatic hyperplasia.  Risks, benefits and alternatives were explained and the patient decided to proceed.  Description of procedure: The indications, alternatives, benefits and risks were discussed with the patient and informed consent was obtained.  The patient was brought onto the operating room table, positioned supine and secured with a safety strap.  Pneumatic compression devices were placed on the lower extremities.  After administration of intravenous antibiotics and general anesthesia, the patient was repositioned in the dorsal lithotomy position and all pressure points were carefully padded.  The genitalia were prepped and draped in the standard sterile manner.  Timeout was completed, verifying the correct patient, surgical procedure, and positioning prior to beginning the procedure.  Isotonic normal saline was used for irrigation.  The patient's urethra was calibrated to 30 [place] with sequential [employer] sounds.  Next, a 44 French continuous-flow resectoscope was inserted into the patient's bladder using the visual obturator.  This was then exchanged for the  laser bridge.  On cystoscopic evaluation, there were no tumors, stones or foreign bodies or diverticula present.  The bladder wall appeared trabeculated.  Both orifices were in the normal anatomic position with clear urinary reflux noted bilaterally.  The location of the ureteral orifices and prostatic configuration was again confirmed.  Using the 550 m holmium Moses laser fiber, we first made an incision starting above the verumontanum taking this down to the capsule.  We then made an incision starting at the bladder neck at 5 and 7 o'clock, taking these down to the level of the verumontanum.  We then enucleated the median lobe in a retrograde fashion.  We then extended our 5 o'clock incision around the apex of the prostate on the left side.  We then made a 1 o'clock incision at the bladder neck and carried this around the apex of the prostate to meet our 5 o'clock incision.  We then enucleated the left lateral lobe in a retrograde fashion.  This dissection was quite challenging.  There was some perforation of the capsule in a couple of places however hemostasis was achieved.  There was some undermining of the bladder neck on the left side in the prostate was enucleated just distal to the left ureteral orifice.  Clear yellow urine was seen emanating from the left ureteral orifice.  We confirm that there is no harm done to the left ureter I performing a left retrograde pyelogram demonstrating J hooking of the distal left ureter and contrast extending into the renal pelvis.  There is no extravasation of contrast.  Given the large size of the prostate, the long length of the case, and the beneficial effect of already resecting the median lobe in the left lateral  lobe, we elected not to proceed with resection of the right lateral lobe.  I am certain that who be able to void without obstruction with this resection.  However if there is difficulty, we could proceed in a staged fashion to resect his right lateral  lobe.  We again assured hemostasis. We then exchanged the continuous flow resectoscope for the offset nephroscope with the tissue morcellator and morcellated all chips.  We then reinserted the continuous flow resectoscope and used the laser to ensure hemostasis.  We also used the roller bar to ensure hemostasis. Once this was done, the scope was removed and a 22-French 3-way Foley catheter was placed over a wire, 50 mL sterile water in the balloon.   Plan: Continue bladder irrigation overnight.  Hand irrigate as needed for clots or decreased drainage.  Plan to leave the Foley catheter in place for 1 week given deep dissection with some perforation of the capsule.  We will plan to void trial in the office next Friday.  Plan to discharge home tomorrow with Foley in place.  [facility]  Pager: [phone]

## 2020-02-29 NOTE — Anesthesia Procedure Notes (Signed)
Procedure Name: Intubation Date/Time: 02/29/2020 12:20 PM Performed by: Claudia Desanctis, CRNA Pre-anesthesia Checklist: Patient identified, Emergency Drugs available, Suction available and Patient being monitored Patient Re-evaluated:Patient Re-evaluated prior to induction Oxygen Delivery Method: Circle system utilized Preoxygenation: Pre-oxygenation with 100% oxygen Induction Type: IV induction Ventilation: Mask ventilation without difficulty Laryngoscope Size: 2 and Miller Grade View: Grade I Tube type: Oral Tube size: 7.5 mm Number of attempts: 1 Airway Equipment and Method: Stylet Placement Confirmation: ETT inserted through vocal cords under direct vision,  positive ETCO2 and breath sounds checked- equal and bilateral Secured at: 22 cm Tube secured with: Tape Dental Injury: Teeth and Oropharynx as per pre-operative assessment

## 2020-02-29 NOTE — Transfer of Care (Signed)
Immediate Anesthesia Transfer of Care Note  Patient: Carlos Alvarez  Procedure(s) Performed: Holmium Laser Enucleation of the Prostate with Morcellation (N/A ) CYSTOSCOPY WITH LITHOLAPAXY,LEFT RETROGRADE PYLEOGRAM (N/A )  Patient Location: PACU  Anesthesia Type:General  Level of Consciousness: awake, alert , oriented and patient cooperative  Airway & Oxygen Therapy: Patient Spontanous Breathing and Patient connected to face mask  Post-op Assessment: Report given to RN and Post -op Vital signs reviewed and stable  Post vital signs: Reviewed and stable  Last Vitals:  Vitals Value Taken Time  BP 132/73 02/29/20 1607  Temp    Pulse 100 02/29/20 1610  Resp 21 02/29/20 1610  SpO2 98 % 02/29/20 1610  Vitals shown include unvalidated device data.  Last Pain:  Vitals:   02/29/20 1031  TempSrc: Oral  PainSc: 0-No pain      Patients Stated Pain Goal: 2 (33/54/56 2563)  Complications: No complications documented.

## 2020-03-01 DIAGNOSIS — N320 Bladder-neck obstruction: Secondary | ICD-10-CM | POA: Diagnosis not present

## 2020-03-01 DIAGNOSIS — N200 Calculus of kidney: Secondary | ICD-10-CM | POA: Diagnosis not present

## 2020-03-01 DIAGNOSIS — K8020 Calculus of gallbladder without cholecystitis without obstruction: Secondary | ICD-10-CM | POA: Diagnosis not present

## 2020-03-01 DIAGNOSIS — N138 Other obstructive and reflux uropathy: Secondary | ICD-10-CM | POA: Diagnosis not present

## 2020-03-01 DIAGNOSIS — N401 Enlarged prostate with lower urinary tract symptoms: Secondary | ICD-10-CM | POA: Diagnosis not present

## 2020-03-01 DIAGNOSIS — R338 Other retention of urine: Secondary | ICD-10-CM | POA: Diagnosis not present

## 2020-03-01 LAB — CBC
HCT: 35.4 % — ABNORMAL LOW (ref 39.0–52.0)
Hemoglobin: 11.5 g/dL — ABNORMAL LOW (ref 13.0–17.0)
MCH: 30.7 pg (ref 26.0–34.0)
MCHC: 32.5 g/dL (ref 30.0–36.0)
MCV: 94.7 fL (ref 80.0–100.0)
Platelets: 293 10*3/uL (ref 150–400)
RBC: 3.74 MIL/uL — ABNORMAL LOW (ref 4.22–5.81)
RDW: 13.2 % (ref 11.5–15.5)
WBC: 13 10*3/uL — ABNORMAL HIGH (ref 4.0–10.5)
nRBC: 0 % (ref 0.0–0.2)

## 2020-03-01 LAB — BASIC METABOLIC PANEL
Anion gap: 12 (ref 5–15)
BUN: 10 mg/dL (ref 8–23)
CO2: 26 mmol/L (ref 22–32)
Calcium: 8.7 mg/dL — ABNORMAL LOW (ref 8.9–10.3)
Chloride: 94 mmol/L — ABNORMAL LOW (ref 98–111)
Creatinine, Ser: 0.86 mg/dL (ref 0.61–1.24)
GFR, Estimated: >60 mL/min (ref >60)
Glucose, Bld: 150 mg/dL — ABNORMAL HIGH (ref 70–99)
Potassium: 3.6 mmol/L (ref 3.5–5.1)
Sodium: 132 mmol/L — ABNORMAL LOW (ref 135–145)

## 2020-03-01 LAB — GLUCOSE, CAPILLARY
Glucose-Capillary: 151 mg/dL — ABNORMAL HIGH (ref 70–99)
Glucose-Capillary: 187 mg/dL — ABNORMAL HIGH (ref 70–99)

## 2020-03-01 MED ORDER — CHLORHEXIDINE GLUCONATE CLOTH 2 % EX PADS
6.0000 | MEDICATED_PAD | Freq: Every day | CUTANEOUS | Status: DC
  Administered 2020-03-01: 6 via TOPICAL

## 2020-03-01 NOTE — Progress Notes (Signed)
Patient's IV was taken out and was given discharge instructions and education per discharge orders. Patient was able to verbalize understanding of the discharge instructions. Patient is stable and leaving the hospital to go home per discharge orders.

## 2020-03-01 NOTE — Plan of Care (Signed)
  Problem: Education: Goal: Knowledge of General Education information will improve Description: Including pain rating scale, medication(s)/side effects and non-pharmacologic comfort measures Outcome: Completed/Met   Problem: Health Behavior/Discharge Planning: Goal: Ability to manage health-related needs will improve Outcome: Completed/Met   Problem: Clinical Measurements: Goal: Ability to maintain clinical measurements within normal limits will improve Outcome: Completed/Met Goal: Will remain free from infection Outcome: Completed/Met Goal: Diagnostic test results will improve Outcome: Completed/Met Goal: Respiratory complications will improve Outcome: Completed/Met Goal: Cardiovascular complication will be avoided Outcome: Completed/Met   Problem: Activity: Goal: Risk for activity intolerance will decrease Outcome: Completed/Met   Problem: Nutrition: Goal: Adequate nutrition will be maintained Outcome: Completed/Met   Problem: Coping: Goal: Level of anxiety will decrease Outcome: Completed/Met   Problem: Elimination: Goal: Will not experience complications related to bowel motility Outcome: Completed/Met Goal: Will not experience complications related to urinary retention Outcome: Completed/Met   Problem: Pain Managment: Goal: General experience of comfort will improve Outcome: Completed/Met   Problem: Safety: Goal: Ability to remain free from injury will improve Outcome: Completed/Met   Problem: Skin Integrity: Goal: Risk for impaired skin integrity will decrease Outcome: Completed/Met   Problem: Education: Goal: Required Educational Video(s) Outcome: Completed/Met   Problem: Clinical Measurements: Goal: Ability to maintain clinical measurements within normal limits will improve Outcome: Completed/Met Goal: Postoperative complications will be avoided or minimized Outcome: Completed/Met   Problem: Skin Integrity: Goal: Demonstration of wound healing  without infection will improve Outcome: Completed/Met   Patient has orders to be discharged. Will be giving patient discharge education/instructions.

## 2020-03-01 NOTE — Discharge Summary (Signed)
Date of admission: 02/29/2020  Date of discharge: 03/01/2020  Admission diagnosis: BPH  Discharge diagnosis: BPH  Secondary diagnoses: None  History and Physical: For full details, please see admission history and physical. Briefly, Carlos Alvarez is a 74 y.o. year old patient with with urinary retention and irritative voiding.  He underwent HoLEP on 02/29/20. He tolerated the procedure well and was transferred to the floor after PACU. CBI was continued overnight and clamped on POD1 with clear yellow urine on morning of POD1. The decision was made for discharge on POD1 after patient was tolerating regular diet, ambulating without difficulty and pain was controlled with PO pain medication.  He will discharge with foley catheter in place and return later this week for catheter removal and trial of void with peri-cath-pull antibiotics.    Laboratory values: Recent Labs    02/29/20 1927 03/01/20 0729  HGB 12.3* 11.5*  HCT 36.7* 35.4*   Recent Labs    02/29/20 1927 03/01/20 0729  CREATININE 1.06 0.86    Disposition: Home  Discharge instruction: The patient was instructed to be ambulatory but told to refrain from heavy lifting, strenuous activity, or driving.   Discharge medications:  Allergies as of 03/01/2020   No Known Allergies     Medication List    TAKE these medications   acetaminophen 500 MG tablet Commonly known as: TYLENOL Take 500 mg by mouth every 6 (six) hours as needed for mild pain or headache.   amLODipine 5 MG tablet Commonly known as: NORVASC Take 1 tablet (5 mg total) by mouth daily.   atenolol-chlorthalidone 50-25 MG tablet Commonly known as: TENORETIC Take 1 tablet by mouth daily.   cephALEXin 500 MG capsule Commonly known as: KEFLEX Take 1 capsule (500 mg total) by mouth 2 (two) times daily for 3 days.   diphenhydramine-acetaminophen 25-500 MG Tabs tablet Commonly known as: TYLENOL PM Take 2 tablets by mouth at bedtime.   docusate sodium 100  MG capsule Commonly known as: Colace Take 1 capsule (100 mg total) by mouth daily as needed for up to 30 doses.   finasteride 5 MG tablet Commonly known as: PROSCAR Take 1 tablet (5 mg total) by mouth daily.   GARLIC PO Take 1 tablet by mouth daily.   metFORMIN 500 MG 24 hr tablet Commonly known as: GLUCOPHAGE-XR Take 1,000 mg by mouth 2 (two) times daily.   Muscle Rub 10-15 % Crea Apply 1 application topically as needed for muscle pain.   oxyCODONE-acetaminophen 5-325 MG tablet Commonly known as: Percocet Take 1 tablet by mouth every 4 (four) hours as needed for up to 10 doses for severe pain.   potassium chloride SA 20 MEQ tablet Commonly known as: KLOR-CON Take 2 tablets (40 mEq total) by mouth 2 (two) times daily.   tamsulosin 0.4 MG Caps capsule Commonly known as: FLOMAX Take 1 capsule (0.4 mg total) by mouth daily after supper.       Followup:   Follow-up Information    [facility] On 03/07/2020.   Why: 8AM Contact information: [address] [phone]

## 2020-03-03 ENCOUNTER — Encounter (HOSPITAL_COMMUNITY): Payer: Self-pay | Admitting: Urology

## 2020-03-03 NOTE — Anesthesia Postprocedure Evaluation (Signed)
Anesthesia Post Note  Patient: HAROUT SCHEURICH  Procedure(s) Performed: Holmium Laser Enucleation of the Prostate with Morcellation (N/A ) CYSTOSCOPY WITH LITHOLAPAXY,LEFT RETROGRADE PYLEOGRAM (N/A )     Patient location during evaluation: PACU Anesthesia Type: General Level of consciousness: awake and alert Pain management: pain level controlled Vital Signs Assessment: post-procedure vital signs reviewed and stable Respiratory status: spontaneous breathing, nonlabored ventilation, respiratory function stable and patient connected to nasal cannula oxygen Cardiovascular status: blood pressure returned to baseline and stable Postop Assessment: no apparent nausea or vomiting Anesthetic complications: no   No complications documented.  Last Vitals:  Vitals:   03/01/20 0624 03/01/20 1149  BP: 113/70 126/63  Pulse: 78 73  Resp: 18 18  Temp: 36.5 C 36.7 C  SpO2: 96% 97%    Last Pain:  Vitals:   03/01/20 1149  TempSrc: Oral  PainSc:                  Sadeen Wiegel L Lougenia Morrissey

## 2020-03-04 LAB — SURGICAL PATHOLOGY

## 2020-03-07 DIAGNOSIS — R8271 Bacteriuria: Secondary | ICD-10-CM | POA: Diagnosis not present

## 2020-04-03 DIAGNOSIS — R338 Other retention of urine: Secondary | ICD-10-CM | POA: Diagnosis not present

## 2020-07-29 DIAGNOSIS — N401 Enlarged prostate with lower urinary tract symptoms: Secondary | ICD-10-CM | POA: Diagnosis not present

## 2020-07-29 DIAGNOSIS — R3914 Feeling of incomplete bladder emptying: Secondary | ICD-10-CM | POA: Diagnosis not present

## 2020-09-03 DIAGNOSIS — I10 Essential (primary) hypertension: Secondary | ICD-10-CM | POA: Diagnosis not present

## 2020-09-03 DIAGNOSIS — E1165 Type 2 diabetes mellitus with hyperglycemia: Secondary | ICD-10-CM | POA: Diagnosis not present

## 2020-09-03 DIAGNOSIS — E782 Mixed hyperlipidemia: Secondary | ICD-10-CM | POA: Diagnosis not present

## 2020-09-03 DIAGNOSIS — Z125 Encounter for screening for malignant neoplasm of prostate: Secondary | ICD-10-CM | POA: Diagnosis not present

## 2020-09-26 DIAGNOSIS — E1165 Type 2 diabetes mellitus with hyperglycemia: Secondary | ICD-10-CM | POA: Diagnosis not present

## 2020-09-26 DIAGNOSIS — E782 Mixed hyperlipidemia: Secondary | ICD-10-CM | POA: Diagnosis not present

## 2020-09-26 DIAGNOSIS — I10 Essential (primary) hypertension: Secondary | ICD-10-CM | POA: Diagnosis not present

## 2020-09-26 DIAGNOSIS — M170 Bilateral primary osteoarthritis of knee: Secondary | ICD-10-CM | POA: Diagnosis not present

## 2020-09-26 DIAGNOSIS — Z0000 Encounter for general adult medical examination without abnormal findings: Secondary | ICD-10-CM | POA: Diagnosis not present

## 2020-09-26 DIAGNOSIS — M419 Scoliosis, unspecified: Secondary | ICD-10-CM | POA: Diagnosis not present

## 2020-09-26 DIAGNOSIS — I700 Atherosclerosis of aorta: Secondary | ICD-10-CM | POA: Diagnosis not present

## 2020-09-26 DIAGNOSIS — M150 Primary generalized (osteo)arthritis: Secondary | ICD-10-CM | POA: Diagnosis not present

## 2021-01-14 DIAGNOSIS — H900 Conductive hearing loss, bilateral: Secondary | ICD-10-CM | POA: Diagnosis not present

## 2021-01-14 DIAGNOSIS — H6123 Impacted cerumen, bilateral: Secondary | ICD-10-CM | POA: Diagnosis not present

## 2021-02-04 DIAGNOSIS — H6121 Impacted cerumen, right ear: Secondary | ICD-10-CM | POA: Diagnosis not present

## 2021-02-04 DIAGNOSIS — H838X3 Other specified diseases of inner ear, bilateral: Secondary | ICD-10-CM | POA: Diagnosis not present

## 2021-02-04 DIAGNOSIS — H903 Sensorineural hearing loss, bilateral: Secondary | ICD-10-CM | POA: Diagnosis not present

## 2021-03-27 DIAGNOSIS — I700 Atherosclerosis of aorta: Secondary | ICD-10-CM | POA: Diagnosis not present

## 2021-03-27 DIAGNOSIS — E782 Mixed hyperlipidemia: Secondary | ICD-10-CM | POA: Diagnosis not present

## 2021-03-27 DIAGNOSIS — I10 Essential (primary) hypertension: Secondary | ICD-10-CM | POA: Diagnosis not present

## 2021-03-27 DIAGNOSIS — E1165 Type 2 diabetes mellitus with hyperglycemia: Secondary | ICD-10-CM | POA: Diagnosis not present

## 2021-04-03 DIAGNOSIS — Z23 Encounter for immunization: Secondary | ICD-10-CM | POA: Diagnosis not present

## 2021-04-03 DIAGNOSIS — E782 Mixed hyperlipidemia: Secondary | ICD-10-CM | POA: Diagnosis not present

## 2021-04-03 DIAGNOSIS — I10 Essential (primary) hypertension: Secondary | ICD-10-CM | POA: Diagnosis not present

## 2021-04-03 DIAGNOSIS — M150 Primary generalized (osteo)arthritis: Secondary | ICD-10-CM | POA: Diagnosis not present

## 2021-04-03 DIAGNOSIS — M419 Scoliosis, unspecified: Secondary | ICD-10-CM | POA: Diagnosis not present

## 2021-04-03 DIAGNOSIS — E1169 Type 2 diabetes mellitus with other specified complication: Secondary | ICD-10-CM | POA: Diagnosis not present

## 2021-04-03 DIAGNOSIS — I700 Atherosclerosis of aorta: Secondary | ICD-10-CM | POA: Diagnosis not present

## 2021-05-05 IMAGING — MR MR HIP*L* W/O CM
5 series · 37 of 40 positions shown · non-contrast
Comparison: Plain films of the left hip today.

CLINICAL DATA: Left hip pain since a fall 2 days ago. Initial
encounter.

EXAM:
MR OF THE LEFT HIP WITHOUT CONTRAST
TECHNIQUE: Multiplanar, multisequence MR imaging was performed. No intravenous
contrast was administered.

[Series 6: T1 · coronal · left · 3.0mm · 0.78mm/px · 9 of 38 slices shown]
[im 1/38]
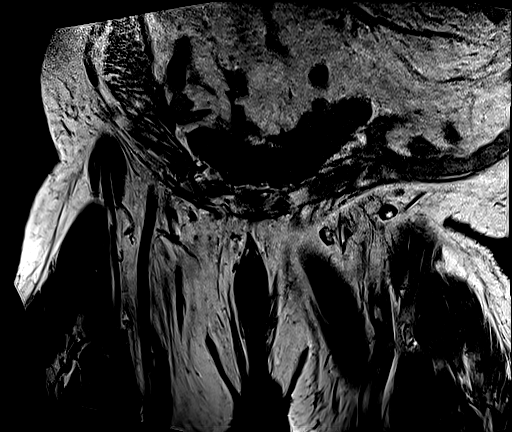
[im 5/38]
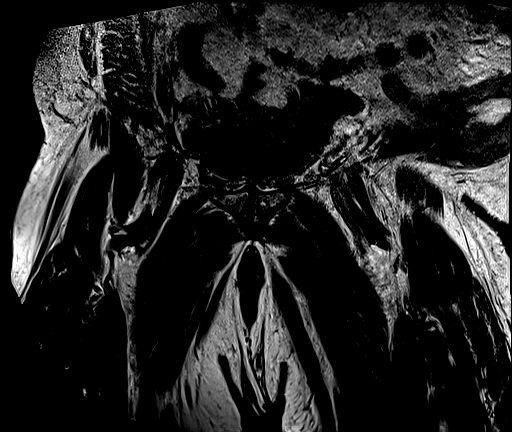
[im 10/38]
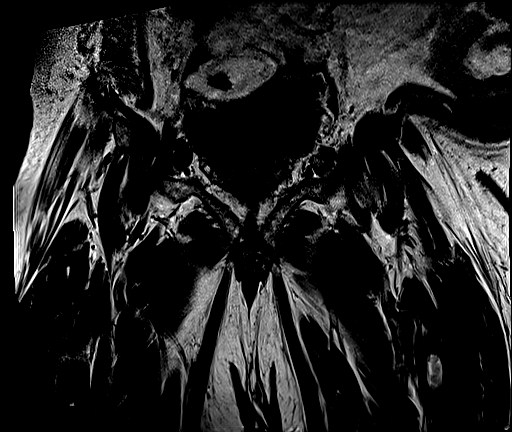
[im 14/38]
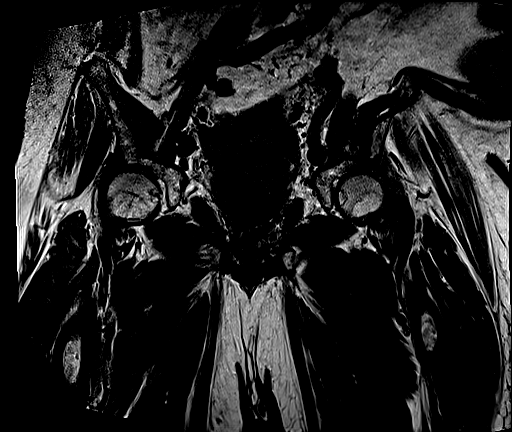
[im 19/38]
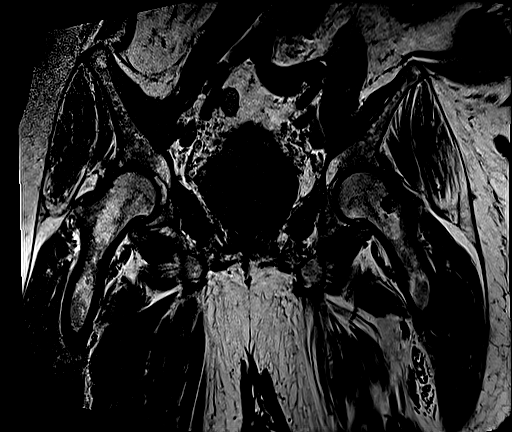
[im 24/38]
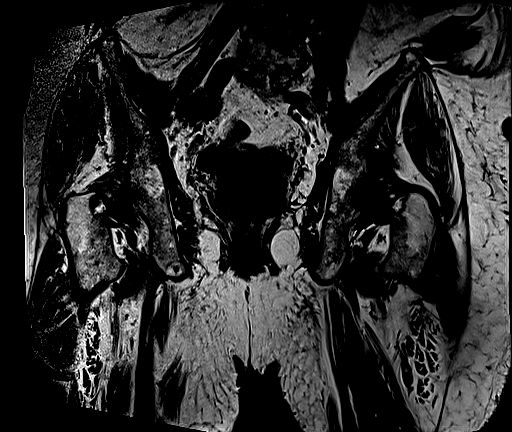
[im 28/38]
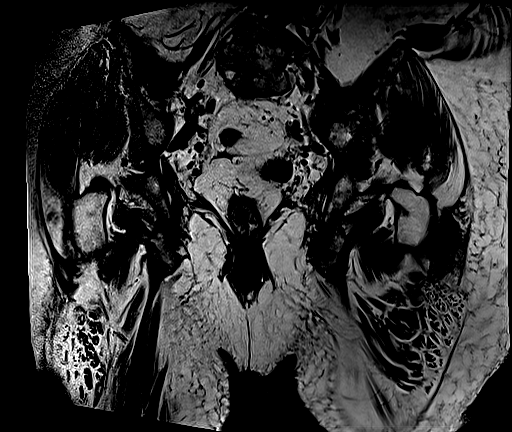
[im 33/38]
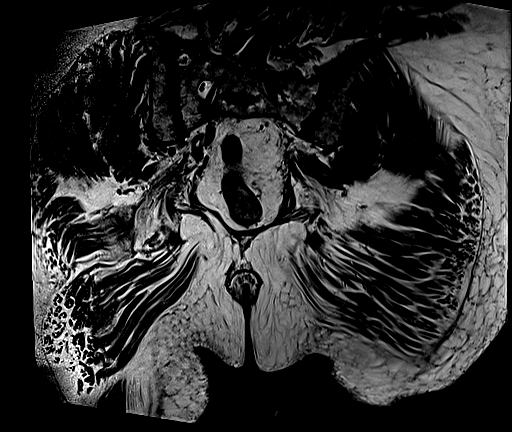
[im 38/38]
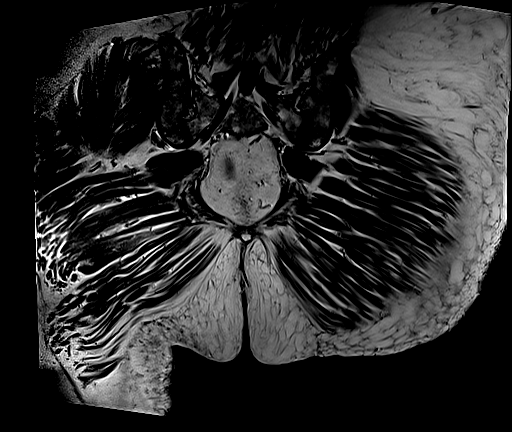

[Series 7: STIR · coronal · left · 3.0mm · 1.25mm/px · 6 of 38 slices shown]
[im 1/38]
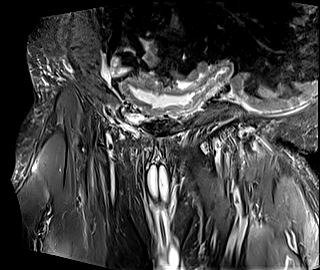
[im 5/38]
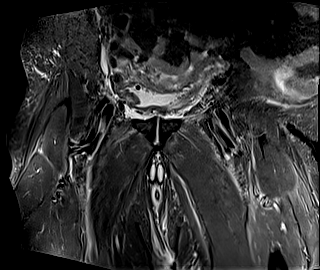
[im 10/38]
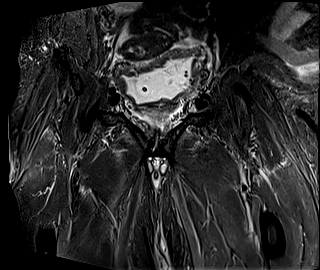
[im 14/38]
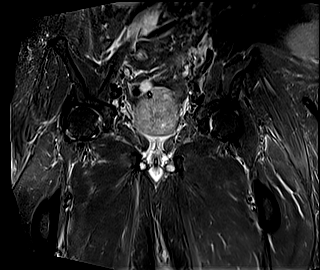
[im 24/38]
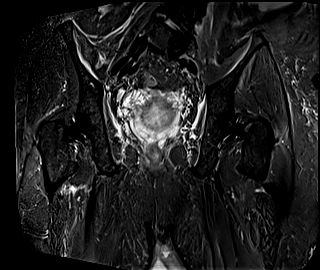
[im 28/38]
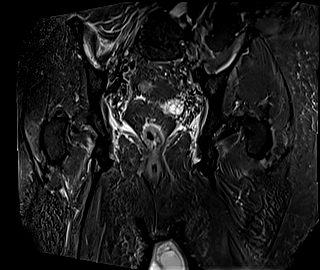

[Series 8: PD fat-sat · coronal · left · 4.0mm · 0.56mm/px · 6 of 24 slices shown (1 of 2)]
[im 1/24]
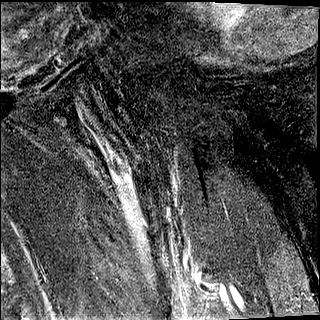
[im 5/24]
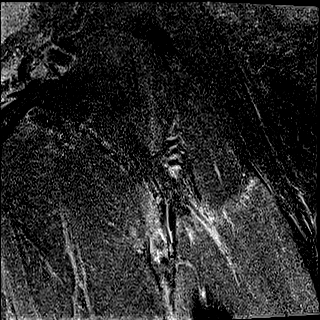
[im 10/24]
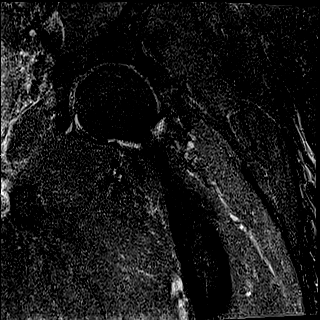
[im 14/24]
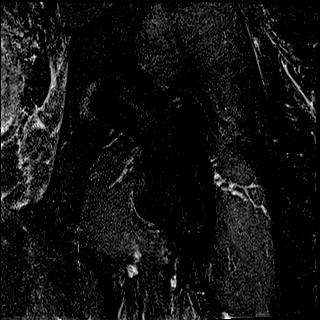
[im 19/24]
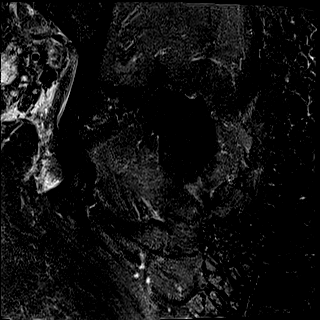
[im 24/24]
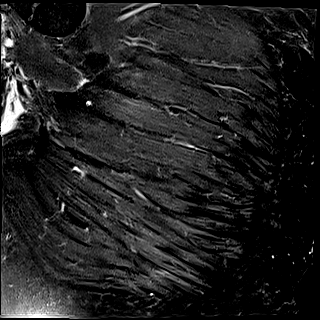

[Series 9: T2 fat-sat · axial · left · 4.0mm · 0.56mm/px · z∈[-82,+104]mm · 9 of 40 slices shown]
[im 1/40]
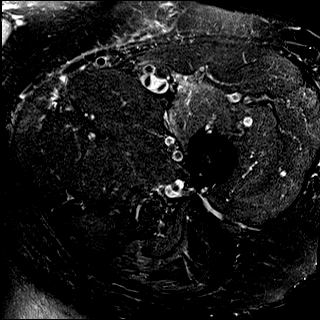
[im 5/40]
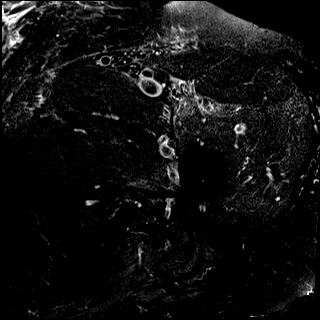
[im 10/40]
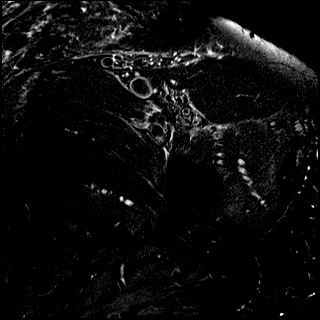
[im 15/40]
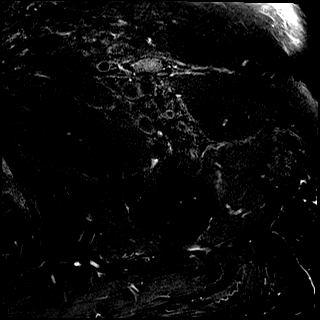
[im 20/40]
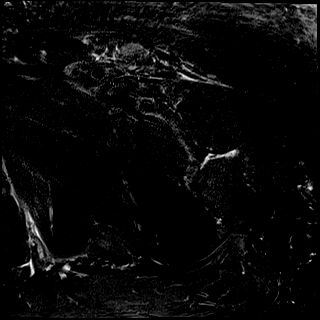
[im 25/40]
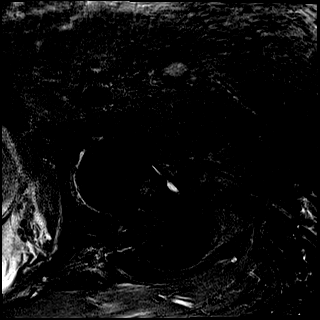
[im 30/40]
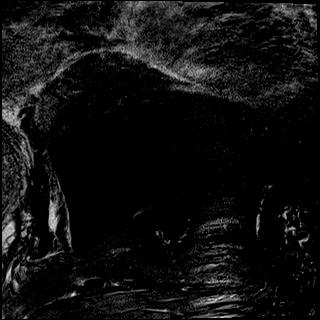
[im 35/40]
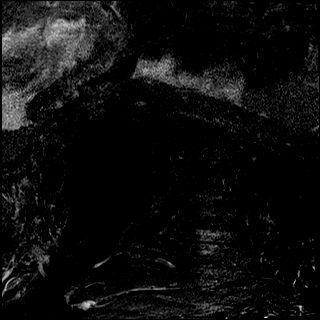
[im 40/40]
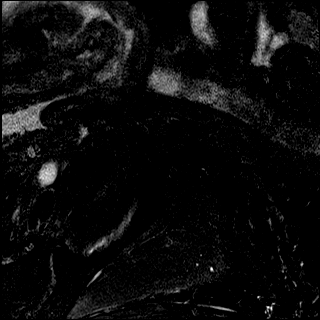

[Series 10: PD fat-sat · sagittal · left · 4.0mm · 0.56mm/px · 7 of 32 slices shown (2 of 2)]
[im 1/32]
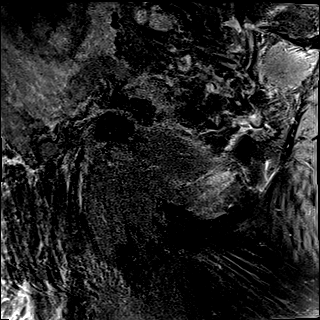
[im 6/32]
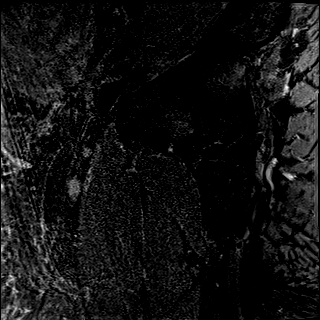
[im 11/32]
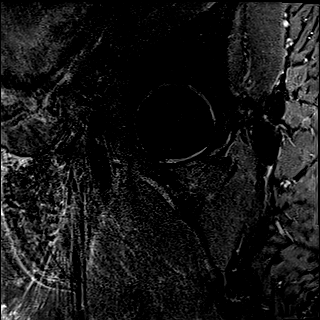
[im 16/32]
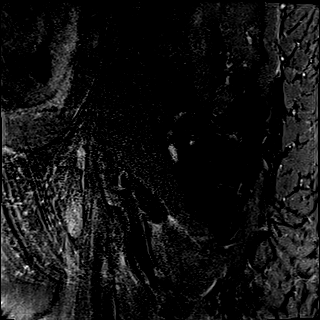
[im 21/32]
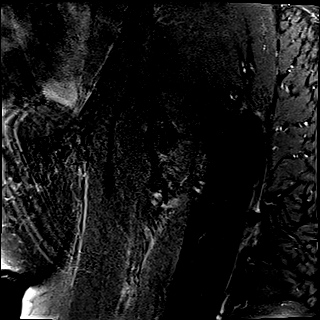
[im 26/32]
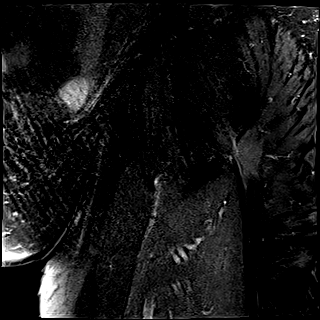
[im 32/32]
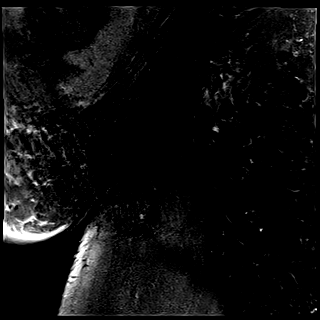

[37 of 40 positions shown; findings below may reference images not displayed]

FINDINGS: Bones: There is no fracture or worrisome lesion. Small sclerotic
focus in the superior margin of the left femoral neck seen on plain
films has benign features and may be an enchondroma. No worrisome
marrow lesion is identified. There is mild degenerative disease
about the hips. Moderate SI joint osteoarthritis is also seen.
Convex left lumbar curvature may be positional.

Articular cartilage and labrum

Articular cartilage:  Mildly degenerated.

Labrum:  Appears intact.

Joint or bursal effusion

Joint effusion:  None.

Bursae: Negative.

Muscles and tendons

Muscles and tendons:  Intact.

Other findings

Miscellaneous: The prostate is visualized in the coronal plane only
and measures approximately 8 cm craniocaudal by 8 cm transverse.
Foley catheter is in place in the urinary bladder. There is air in
the bladder likely from catheterization. Walls of the bladder are
thickened.
IMPRESSION: Negative for fracture. No acute abnormality. Small sclerotic focus
in the superior aspect of the left femoral neck on plain films has
benign features and may be a enchondroma. No follow-up imaging is
recommended.

Massive prostatomegaly. Although the bladder is incompletely
distended, its walls are markedly thickened compatible with bladder
outlet obstruction and/or cystitis.

Convex left lumbar curvature is partially imaged and may be
positional.

## 2021-05-05 IMAGING — CR DG HIP (WITH OR WITHOUT PELVIS) 2-3V*L*
4 series · 4 of 4 positions shown · non-contrast
Comparison: None
COMPARISON: None

Addendum:
CLINICAL DATA: Fall 2 days ago evaluate for fracture

EXAM:
DG HIP (WITH OR WITHOUT PELVIS) 2-3V LEFT

[x hip lat left]
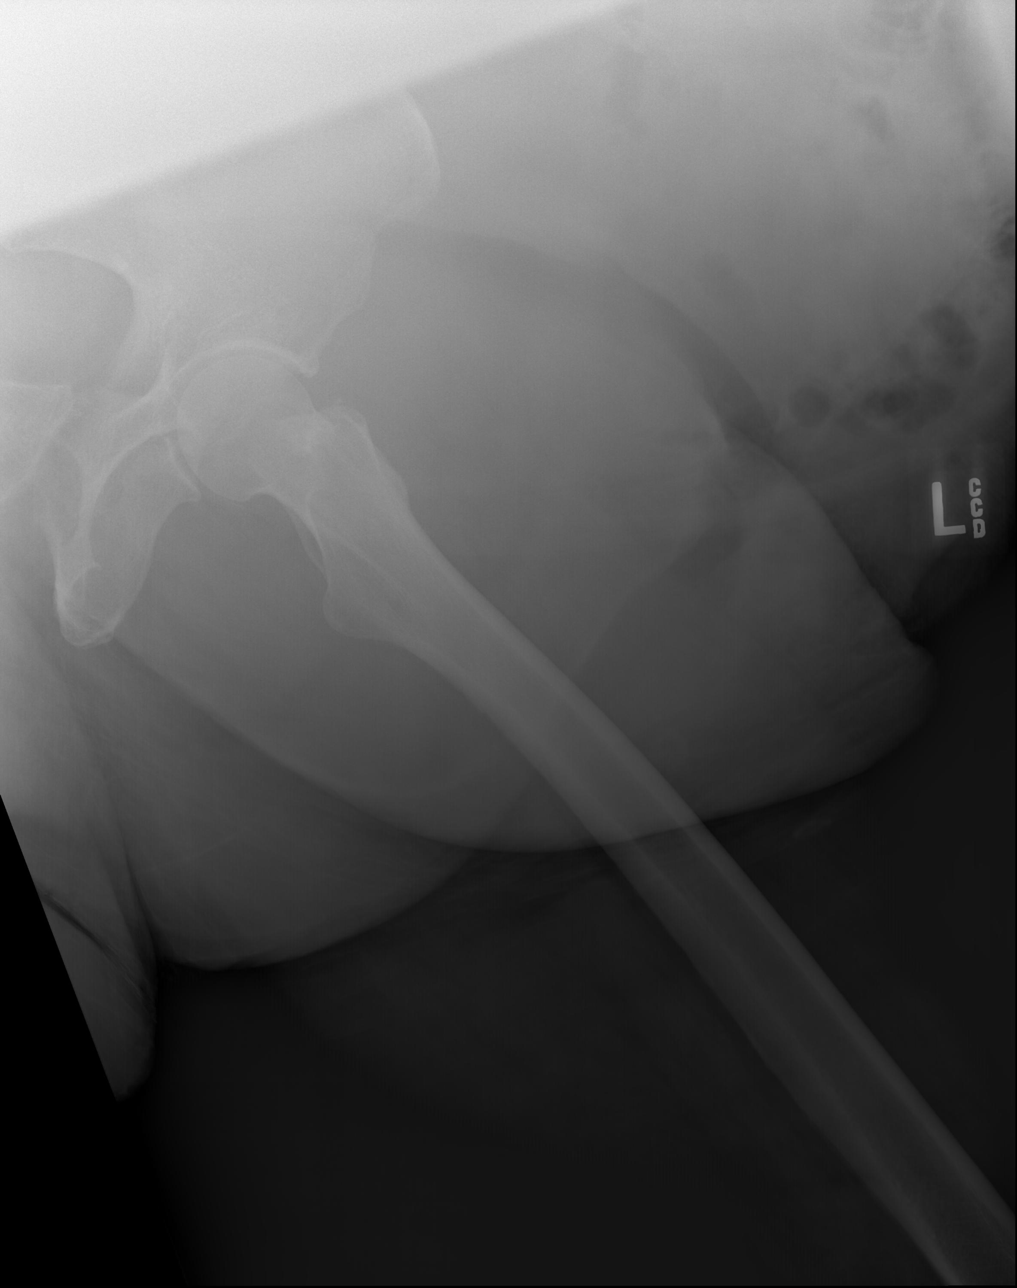

[x pelvis]
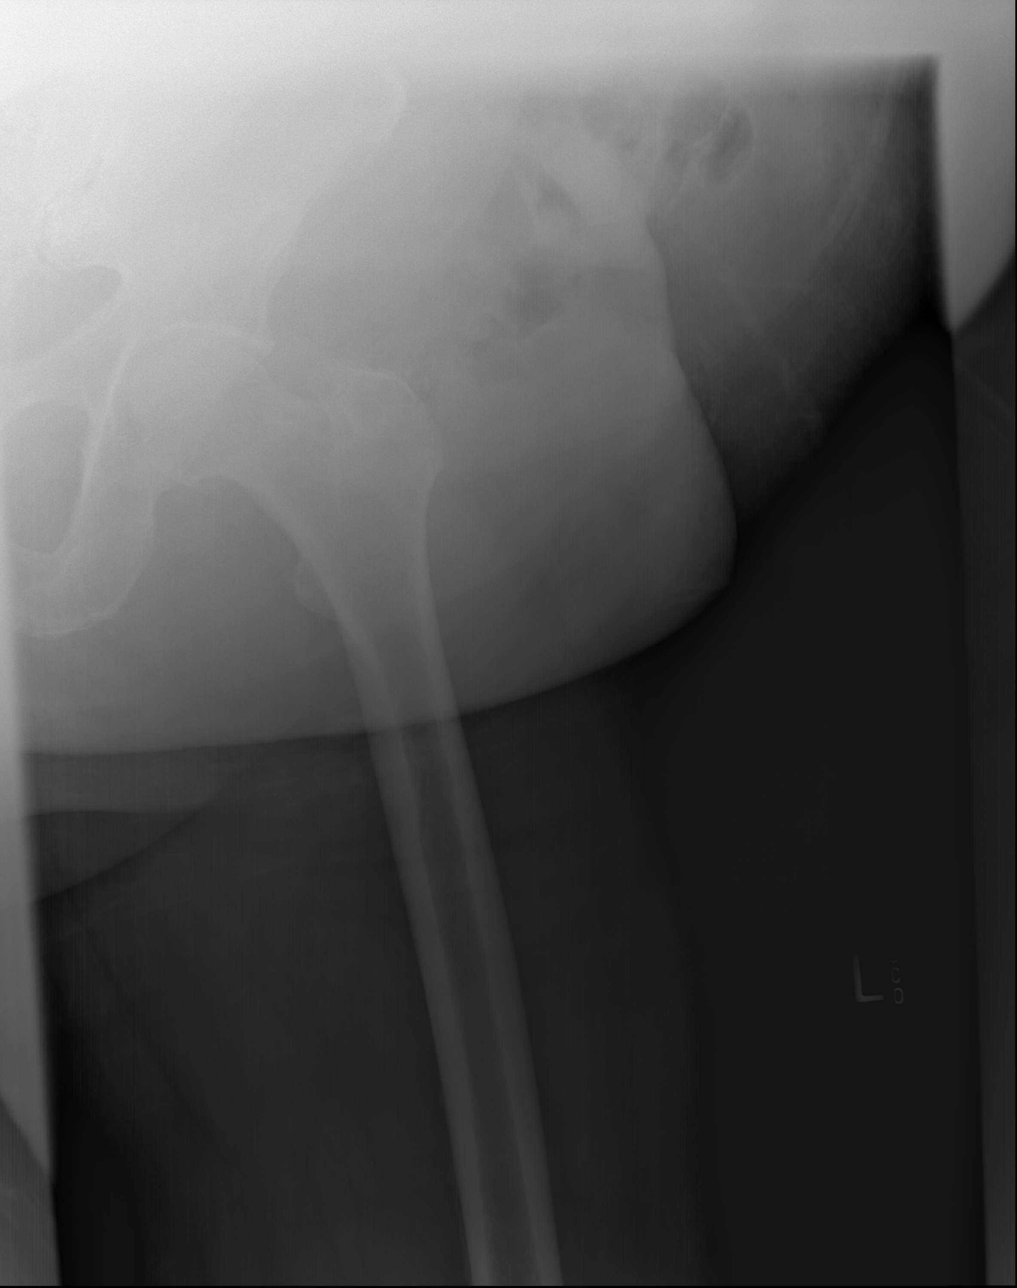

[t pelvis ap]
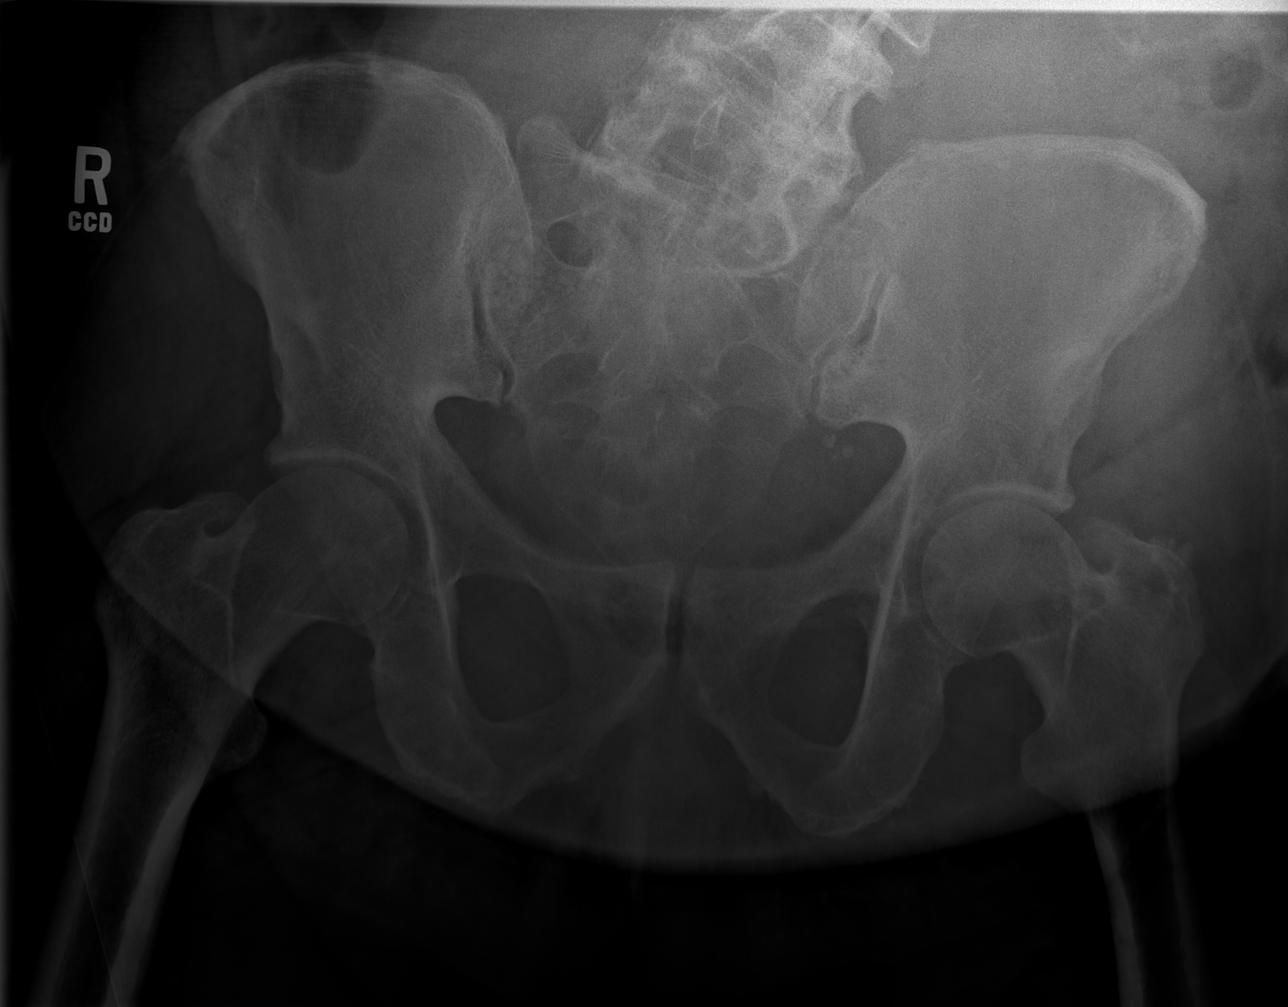

[t hip ap left]
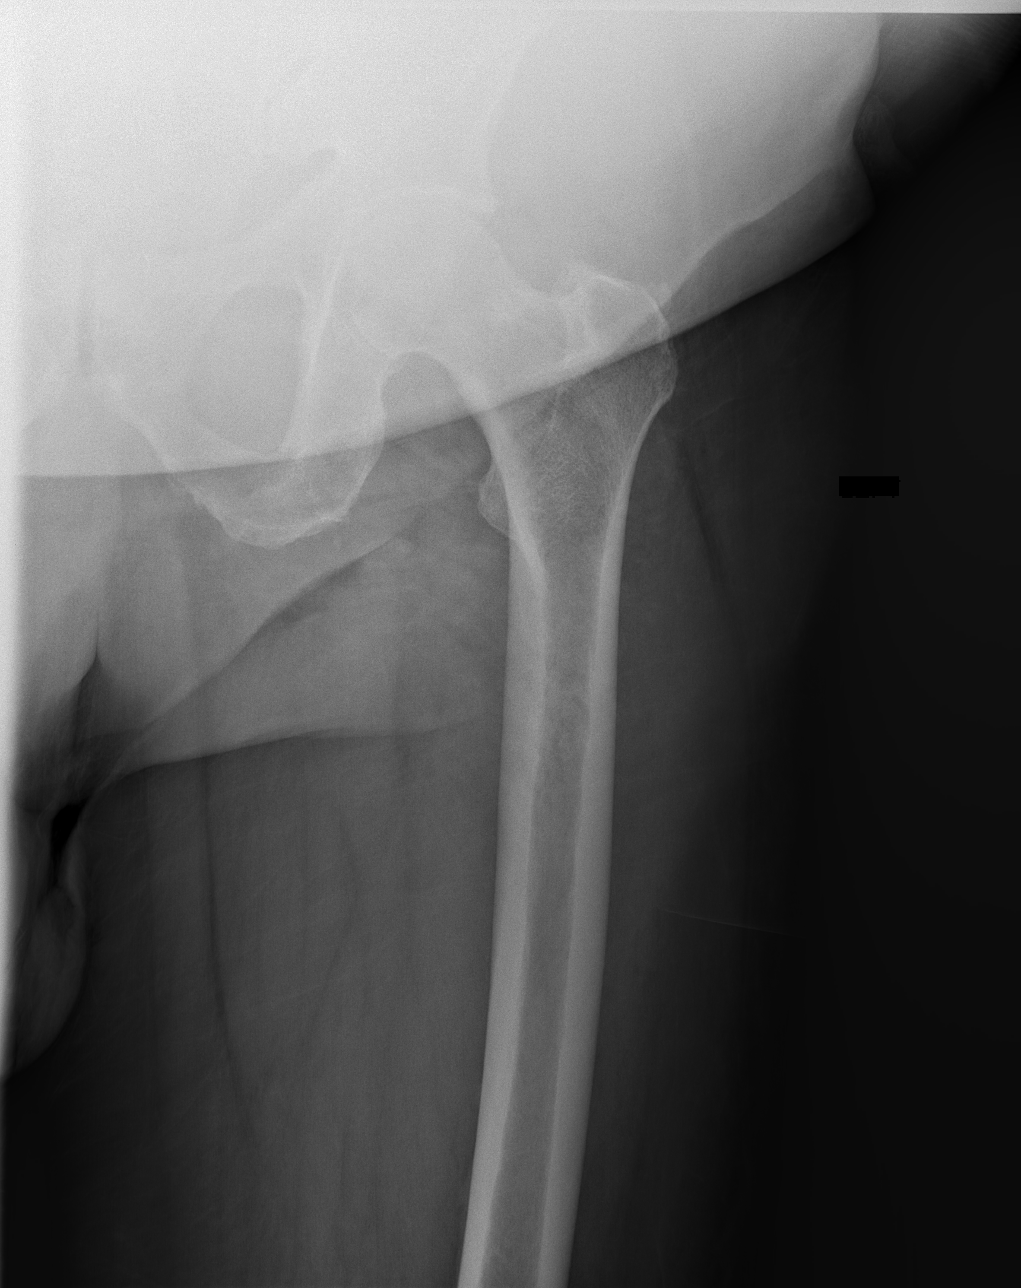

[4 of 4 positions shown; findings below may reference images not displayed]

FINDINGS: Degenerative changes about the hips. Study limited by patient body
habitus without sign of fracture or dislocation.

Marked degenerative changes in the lumbar spine are incidentally
noted. No sign of pelvic fracture.
IMPRESSION: Degenerative changes without signs of fracture or dislocation.

ADDENDUM:
As additional information on the current study there is sclerosis
along the superior margin of the LEFT femoral neck, interrupting
trabecular pattern,, this is of uncertain significance. This may
represent a stress fracture or nondisplaced fracture though could
also represent a synovial herniation pit. Based on the limitations
of the current exam if there is high clinical suspicion for fracture
CT or MRI assessment may be helpful.

These results were called by telephone at the time of interpretation
on 01/03/2020 at [DATE] to provider Dr Benmabrouk, who verbally
acknowledged these results.

*** End of Addendum ***
FINDINGS: Degenerative changes about the hips. Study limited by patient body
habitus without sign of fracture or dislocation.

Marked degenerative changes in the lumbar spine are incidentally
noted. No sign of pelvic fracture.
IMPRESSION: Degenerative changes without signs of fracture or dislocation.

## 2021-06-30 DIAGNOSIS — Z20822 Contact with and (suspected) exposure to covid-19: Secondary | ICD-10-CM | POA: Diagnosis not present

## 2021-07-09 DIAGNOSIS — Z20822 Contact with and (suspected) exposure to covid-19: Secondary | ICD-10-CM | POA: Diagnosis not present

## 2021-07-09 DIAGNOSIS — Z1152 Encounter for screening for COVID-19: Secondary | ICD-10-CM | POA: Diagnosis not present

## 2021-08-08 DIAGNOSIS — Z20822 Contact with and (suspected) exposure to covid-19: Secondary | ICD-10-CM | POA: Diagnosis not present

## 2021-08-22 DIAGNOSIS — Z20822 Contact with and (suspected) exposure to covid-19: Secondary | ICD-10-CM | POA: Diagnosis not present

## 2021-08-26 DIAGNOSIS — Z20822 Contact with and (suspected) exposure to covid-19: Secondary | ICD-10-CM | POA: Diagnosis not present

## 2021-08-31 DIAGNOSIS — Z20822 Contact with and (suspected) exposure to covid-19: Secondary | ICD-10-CM | POA: Diagnosis not present

## 2021-10-01 DIAGNOSIS — R5383 Other fatigue: Secondary | ICD-10-CM | POA: Diagnosis not present

## 2021-10-01 DIAGNOSIS — E1169 Type 2 diabetes mellitus with other specified complication: Secondary | ICD-10-CM | POA: Diagnosis not present

## 2021-10-01 DIAGNOSIS — E782 Mixed hyperlipidemia: Secondary | ICD-10-CM | POA: Diagnosis not present

## 2021-10-01 DIAGNOSIS — I10 Essential (primary) hypertension: Secondary | ICD-10-CM | POA: Diagnosis not present

## 2021-10-08 DIAGNOSIS — E782 Mixed hyperlipidemia: Secondary | ICD-10-CM | POA: Diagnosis not present

## 2021-10-08 DIAGNOSIS — Z0000 Encounter for general adult medical examination without abnormal findings: Secondary | ICD-10-CM | POA: Diagnosis not present

## 2021-10-08 DIAGNOSIS — M150 Primary generalized (osteo)arthritis: Secondary | ICD-10-CM | POA: Diagnosis not present

## 2021-10-08 DIAGNOSIS — E1121 Type 2 diabetes mellitus with diabetic nephropathy: Secondary | ICD-10-CM | POA: Diagnosis not present

## 2021-10-08 DIAGNOSIS — I10 Essential (primary) hypertension: Secondary | ICD-10-CM | POA: Diagnosis not present

## 2021-10-08 DIAGNOSIS — M170 Bilateral primary osteoarthritis of knee: Secondary | ICD-10-CM | POA: Diagnosis not present

## 2021-10-08 DIAGNOSIS — M419 Scoliosis, unspecified: Secondary | ICD-10-CM | POA: Diagnosis not present

## 2021-10-08 DIAGNOSIS — Z23 Encounter for immunization: Secondary | ICD-10-CM | POA: Diagnosis not present

## 2021-10-08 DIAGNOSIS — I700 Atherosclerosis of aorta: Secondary | ICD-10-CM | POA: Diagnosis not present

## 2022-04-01 DIAGNOSIS — N401 Enlarged prostate with lower urinary tract symptoms: Secondary | ICD-10-CM | POA: Diagnosis not present

## 2022-04-01 DIAGNOSIS — R3914 Feeling of incomplete bladder emptying: Secondary | ICD-10-CM | POA: Diagnosis not present

## 2022-05-06 DIAGNOSIS — I10 Essential (primary) hypertension: Secondary | ICD-10-CM | POA: Diagnosis not present

## 2022-05-06 DIAGNOSIS — E1121 Type 2 diabetes mellitus with diabetic nephropathy: Secondary | ICD-10-CM | POA: Diagnosis not present

## 2022-05-06 DIAGNOSIS — I700 Atherosclerosis of aorta: Secondary | ICD-10-CM | POA: Diagnosis not present

## 2022-05-06 DIAGNOSIS — M170 Bilateral primary osteoarthritis of knee: Secondary | ICD-10-CM | POA: Diagnosis not present

## 2022-05-06 DIAGNOSIS — E782 Mixed hyperlipidemia: Secondary | ICD-10-CM | POA: Diagnosis not present

## 2022-05-13 DIAGNOSIS — M170 Bilateral primary osteoarthritis of knee: Secondary | ICD-10-CM | POA: Diagnosis not present

## 2022-05-13 DIAGNOSIS — M150 Primary generalized (osteo)arthritis: Secondary | ICD-10-CM | POA: Diagnosis not present

## 2022-05-13 DIAGNOSIS — I700 Atherosclerosis of aorta: Secondary | ICD-10-CM | POA: Diagnosis not present

## 2022-05-13 DIAGNOSIS — M419 Scoliosis, unspecified: Secondary | ICD-10-CM | POA: Diagnosis not present

## 2022-05-13 DIAGNOSIS — Z23 Encounter for immunization: Secondary | ICD-10-CM | POA: Diagnosis not present

## 2022-05-13 DIAGNOSIS — I10 Essential (primary) hypertension: Secondary | ICD-10-CM | POA: Diagnosis not present

## 2022-05-13 DIAGNOSIS — E1121 Type 2 diabetes mellitus with diabetic nephropathy: Secondary | ICD-10-CM | POA: Diagnosis not present

## 2022-05-13 DIAGNOSIS — E782 Mixed hyperlipidemia: Secondary | ICD-10-CM | POA: Diagnosis not present

## 2022-10-13 DIAGNOSIS — E782 Mixed hyperlipidemia: Secondary | ICD-10-CM | POA: Diagnosis not present

## 2022-10-13 DIAGNOSIS — Z23 Encounter for immunization: Secondary | ICD-10-CM | POA: Diagnosis not present

## 2022-10-13 DIAGNOSIS — I10 Essential (primary) hypertension: Secondary | ICD-10-CM | POA: Diagnosis not present

## 2022-10-13 DIAGNOSIS — M170 Bilateral primary osteoarthritis of knee: Secondary | ICD-10-CM | POA: Diagnosis not present

## 2022-10-13 DIAGNOSIS — M150 Primary generalized (osteo)arthritis: Secondary | ICD-10-CM | POA: Diagnosis not present

## 2022-10-13 DIAGNOSIS — E1121 Type 2 diabetes mellitus with diabetic nephropathy: Secondary | ICD-10-CM | POA: Diagnosis not present

## 2022-10-13 DIAGNOSIS — E1165 Type 2 diabetes mellitus with hyperglycemia: Secondary | ICD-10-CM | POA: Diagnosis not present

## 2022-10-13 DIAGNOSIS — R5383 Other fatigue: Secondary | ICD-10-CM | POA: Diagnosis not present

## 2022-10-13 DIAGNOSIS — M419 Scoliosis, unspecified: Secondary | ICD-10-CM | POA: Diagnosis not present

## 2022-10-13 DIAGNOSIS — I700 Atherosclerosis of aorta: Secondary | ICD-10-CM | POA: Diagnosis not present

## 2022-10-20 DIAGNOSIS — N400 Enlarged prostate without lower urinary tract symptoms: Secondary | ICD-10-CM | POA: Diagnosis not present

## 2022-10-20 DIAGNOSIS — M150 Primary generalized (osteo)arthritis: Secondary | ICD-10-CM | POA: Diagnosis not present

## 2022-10-20 DIAGNOSIS — E1122 Type 2 diabetes mellitus with diabetic chronic kidney disease: Secondary | ICD-10-CM | POA: Diagnosis not present

## 2022-10-20 DIAGNOSIS — I700 Atherosclerosis of aorta: Secondary | ICD-10-CM | POA: Diagnosis not present

## 2022-10-20 DIAGNOSIS — M170 Bilateral primary osteoarthritis of knee: Secondary | ICD-10-CM | POA: Diagnosis not present

## 2022-10-20 DIAGNOSIS — I10 Essential (primary) hypertension: Secondary | ICD-10-CM | POA: Diagnosis not present

## 2022-10-20 DIAGNOSIS — E782 Mixed hyperlipidemia: Secondary | ICD-10-CM | POA: Diagnosis not present

## 2022-10-20 DIAGNOSIS — N182 Chronic kidney disease, stage 2 (mild): Secondary | ICD-10-CM | POA: Diagnosis not present

## 2022-10-20 DIAGNOSIS — Z0000 Encounter for general adult medical examination without abnormal findings: Secondary | ICD-10-CM | POA: Diagnosis not present

## 2022-10-20 DIAGNOSIS — M419 Scoliosis, unspecified: Secondary | ICD-10-CM | POA: Diagnosis not present

## 2023-05-04 DIAGNOSIS — E1122 Type 2 diabetes mellitus with diabetic chronic kidney disease: Secondary | ICD-10-CM | POA: Diagnosis not present

## 2023-05-04 DIAGNOSIS — E782 Mixed hyperlipidemia: Secondary | ICD-10-CM | POA: Diagnosis not present

## 2023-05-04 DIAGNOSIS — I10 Essential (primary) hypertension: Secondary | ICD-10-CM | POA: Diagnosis not present

## 2023-05-04 DIAGNOSIS — N182 Chronic kidney disease, stage 2 (mild): Secondary | ICD-10-CM | POA: Diagnosis not present

## 2023-05-11 DIAGNOSIS — N400 Enlarged prostate without lower urinary tract symptoms: Secondary | ICD-10-CM | POA: Diagnosis not present

## 2023-05-11 DIAGNOSIS — E1122 Type 2 diabetes mellitus with diabetic chronic kidney disease: Secondary | ICD-10-CM | POA: Diagnosis not present

## 2023-05-11 DIAGNOSIS — E782 Mixed hyperlipidemia: Secondary | ICD-10-CM | POA: Diagnosis not present

## 2023-05-11 DIAGNOSIS — M419 Scoliosis, unspecified: Secondary | ICD-10-CM | POA: Diagnosis not present

## 2023-05-11 DIAGNOSIS — N182 Chronic kidney disease, stage 2 (mild): Secondary | ICD-10-CM | POA: Diagnosis not present

## 2023-05-11 DIAGNOSIS — I700 Atherosclerosis of aorta: Secondary | ICD-10-CM | POA: Diagnosis not present

## 2023-05-11 DIAGNOSIS — R269 Unspecified abnormalities of gait and mobility: Secondary | ICD-10-CM | POA: Diagnosis not present

## 2023-05-11 DIAGNOSIS — I10 Essential (primary) hypertension: Secondary | ICD-10-CM | POA: Diagnosis not present

## 2023-05-11 DIAGNOSIS — M170 Bilateral primary osteoarthritis of knee: Secondary | ICD-10-CM | POA: Diagnosis not present

## 2023-05-11 DIAGNOSIS — M150 Primary generalized (osteo)arthritis: Secondary | ICD-10-CM | POA: Diagnosis not present

## 2023-05-19 DIAGNOSIS — N401 Enlarged prostate with lower urinary tract symptoms: Secondary | ICD-10-CM | POA: Diagnosis not present

## 2023-05-19 DIAGNOSIS — R3914 Feeling of incomplete bladder emptying: Secondary | ICD-10-CM | POA: Diagnosis not present

## 2023-05-19 DIAGNOSIS — N200 Calculus of kidney: Secondary | ICD-10-CM | POA: Diagnosis not present

## 2023-11-02 DIAGNOSIS — E782 Mixed hyperlipidemia: Secondary | ICD-10-CM | POA: Diagnosis not present

## 2023-11-02 DIAGNOSIS — M419 Scoliosis, unspecified: Secondary | ICD-10-CM | POA: Diagnosis not present

## 2023-11-02 DIAGNOSIS — R5383 Other fatigue: Secondary | ICD-10-CM | POA: Diagnosis not present

## 2023-11-02 DIAGNOSIS — I700 Atherosclerosis of aorta: Secondary | ICD-10-CM | POA: Diagnosis not present

## 2023-11-02 DIAGNOSIS — I10 Essential (primary) hypertension: Secondary | ICD-10-CM | POA: Diagnosis not present

## 2023-11-02 DIAGNOSIS — N400 Enlarged prostate without lower urinary tract symptoms: Secondary | ICD-10-CM | POA: Diagnosis not present

## 2023-11-02 DIAGNOSIS — M150 Primary generalized (osteo)arthritis: Secondary | ICD-10-CM | POA: Diagnosis not present

## 2023-11-02 DIAGNOSIS — N182 Chronic kidney disease, stage 2 (mild): Secondary | ICD-10-CM | POA: Diagnosis not present

## 2023-11-02 DIAGNOSIS — M170 Bilateral primary osteoarthritis of knee: Secondary | ICD-10-CM | POA: Diagnosis not present

## 2023-11-28 DIAGNOSIS — H60502 Unspecified acute noninfective otitis externa, left ear: Secondary | ICD-10-CM | POA: Diagnosis not present

## 2023-11-28 DIAGNOSIS — I10 Essential (primary) hypertension: Secondary | ICD-10-CM | POA: Diagnosis not present

## 2023-11-28 DIAGNOSIS — E782 Mixed hyperlipidemia: Secondary | ICD-10-CM | POA: Diagnosis not present

## 2023-11-28 DIAGNOSIS — E1122 Type 2 diabetes mellitus with diabetic chronic kidney disease: Secondary | ICD-10-CM | POA: Diagnosis not present

## 2023-11-28 DIAGNOSIS — M170 Bilateral primary osteoarthritis of knee: Secondary | ICD-10-CM | POA: Diagnosis not present

## 2023-11-28 DIAGNOSIS — N400 Enlarged prostate without lower urinary tract symptoms: Secondary | ICD-10-CM | POA: Diagnosis not present

## 2023-11-28 DIAGNOSIS — M419 Scoliosis, unspecified: Secondary | ICD-10-CM | POA: Diagnosis not present

## 2023-11-28 DIAGNOSIS — I700 Atherosclerosis of aorta: Secondary | ICD-10-CM | POA: Diagnosis not present

## 2023-11-28 DIAGNOSIS — N182 Chronic kidney disease, stage 2 (mild): Secondary | ICD-10-CM | POA: Diagnosis not present

## 2023-11-28 DIAGNOSIS — Z0000 Encounter for general adult medical examination without abnormal findings: Secondary | ICD-10-CM | POA: Diagnosis not present

## 2023-11-28 DIAGNOSIS — M150 Primary generalized (osteo)arthritis: Secondary | ICD-10-CM | POA: Diagnosis not present
